# Patient Record
Sex: Male | Born: 1990 | Race: Black or African American | Hispanic: No | Marital: Single | Smoking: Former smoker
Health system: Southern US, Community
[De-identification: ages and names within clinical notes are randomized; demographics above are authoritative.]

## PROBLEM LIST (undated history)

## (undated) DIAGNOSIS — I10 Essential (primary) hypertension: Secondary | ICD-10-CM

## (undated) HISTORY — PX: ORTHOPEDIC SURGERY: SHX850

---
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
## 2014-04-01 ENCOUNTER — Emergency Department (HOSPITAL_COMMUNITY)
Admission: EM | Admit: 2014-04-01 | Discharge: 2014-04-01 | Disposition: A | Discharge status: Home / Self Care | Payer: Self-pay | Attending: Emergency Medicine | Admitting: Emergency Medicine

## 2014-04-01 ENCOUNTER — Encounter (HOSPITAL_COMMUNITY): Payer: Self-pay | Admitting: Emergency Medicine

## 2014-04-01 DIAGNOSIS — W268XXA Contact with other sharp object(s), not elsewhere classified, initial encounter: Secondary | ICD-10-CM | POA: Insufficient documentation

## 2014-04-01 DIAGNOSIS — S61511A Laceration without foreign body of right wrist, initial encounter: Secondary | ICD-10-CM

## 2014-04-01 DIAGNOSIS — Y9389 Activity, other specified: Secondary | ICD-10-CM | POA: Insufficient documentation

## 2014-04-01 DIAGNOSIS — Z87891 Personal history of nicotine dependence: Secondary | ICD-10-CM | POA: Insufficient documentation

## 2014-04-01 DIAGNOSIS — S61509A Unspecified open wound of unspecified wrist, initial encounter: Secondary | ICD-10-CM | POA: Insufficient documentation

## 2014-04-01 DIAGNOSIS — Y929 Unspecified place or not applicable: Secondary | ICD-10-CM | POA: Insufficient documentation

## 2014-04-01 LAB — HIV RAPID SCREEN (BLD OR BODY FLD EXPOSURE): Rapid HIV Screen: NONREACTIVE

## 2014-04-01 NOTE — Discharge Instructions (Signed)
Keep your wrist clean and dry. Do use antibiotic ointment on the wound. The sutures need to be removed in 10-12 days. Recheck sooner for any signs of infection. If you continue to have numbness in your thumb, call Dr Janee Morn, the hand specialist on call today to be rechecked in the next 7-10 days.

## 2014-04-01 NOTE — ED Notes (Signed)
During arrest with police, while closing handcuffs they lacerated his wrist. About a one inch laceration to right wrist. Not bleeding at this time. Also a scrape to left knee, not bleeding at this time.

## 2014-04-01 NOTE — ED Provider Notes (Signed)
CSN: [id number]     Arrival date & time 04/01/14  1202 History   First MD Initiated Contact with Patient 04/01/14 1222     Chief Complaint  Patient presents with  . Extremity Laceration     (Consider location/radiation/quality/duration/timing/severity/associated sxs/prior Treatment) HPI Patient presents to the emergency department escorted by police. Evidently during his arrest he sustained a laceration to his right wrist while he was having handcuffs placed. He complains of numbness of his right little and right ring finger which went away when his handcuffs were removed. He has an abrasion of his knee. He should states his last tetanus was less than 10 years ago. He is otherwise a healthy person.  PCP none  History reviewed. No pertinent past medical history. Past Surgical History  Procedure Laterality Date  . Orthopedic surgery Left     left arm broken twice; plate   History reviewed. No pertinent family history. History  Substance Use Topics  . Smoking status: Former Smoker    Types: Cigarettes    Quit date: 04/01/2013  . Smokeless tobacco: Not on file  . Alcohol Use: 1.8 oz/week    3 Cans of beer per week     Comment: once or twice a week  pt is a Paediatric nurse  Review of Systems  All other systems reviewed and are negative.     Allergies  Review of patient's allergies indicates no known allergies.  Home Medications   Prior to Admission medications   Not on File   BP 143/86  Pulse 69  Temp(Src) 98.4 F (36.9 C) (Oral)  Resp 18  Ht  (1.854 m)  Wt 226 lb (102.513 kg)  BMI 29.82 kg/m2  SpO2 99%  Vital signs normal   Physical Exam  Nursing note and vitals reviewed. Constitutional: He is oriented to person, place, and time. He appears well-developed and well-nourished.  Non-toxic appearance. He does not appear ill. No distress.  HENT:  Head: Normocephalic and atraumatic.  Right Ear: External ear normal.  Left Ear: External ear normal.  Nose: Nose  normal. No mucosal edema or rhinorrhea.  Mouth/Throat: Mucous membranes are normal. No dental abscesses or uvula swelling.  Eyes: Conjunctivae and EOM are normal. Pupils are equal, round, and reactive to light.  Neck: Normal range of motion and full passive range of motion without pain. Neck supple.  Pulmonary/Chest: Effort normal. No respiratory distress. He has no rhonchi. He exhibits no crepitus.  Abdominal: Soft. Normal appearance and bowel sounds are normal.  Musculoskeletal: Normal range of motion. He exhibits no edema and no tenderness.  Moves all extremities well.  Patient has a 1.5 cm laceration on the volar aspect of his right wrist at the base of the thenar eminence. The laceration is just through the dermis. Please see photo. He has good range of motion of his palm. He complains of some decreased sensation on the dorsum of his palm and a small area at the base of the thumb on the volar aspect. He has good range of motion of his thumb. When I test the strength of his radial and ulnar nerves he appears to have some weakness however he also has and weakness in his left not sure about patient effort.  Patient has an abrasion on his left anterior knee  Neurological: He is alert and oriented to person, place, and time. He has normal strength. No cranial nerve deficit.  Skin: Skin is warm, dry and intact. No rash noted. No erythema. No pallor.  Psychiatric: He has a normal mood and affect. His speech is normal and behavior is normal. His mood appears not anxious.        ED Course  Procedures (including critical care time)  LACERATION REPAIR Performed by: Gennie Eisinger L Authorized by: Ward Givens Consent: Verbal consent obtained. Risks and benefits: risks, benefits and alternatives were discussed Consent given by: patient Patient identity confirmed: provided demographic data Prepped and Draped in normal sterile fashion Wound explored  Laceration Location: right wrist volar  aspect  Laceration Length: 1.5 cm  No Foreign Bodies seen or palpated  Anesthesia: local infiltration  Local anesthetic: lidocaine 1%   Anesthetic total: 5 ml  Irrigation method: syringe Amount of cleaning: standard  Skin closure: 4-0 nylon   Number of sutures: 3   Technique: simple interrupte  Patient tolerance: Patient tolerated the procedure well with no immediate complications.      Labs Review   Imaging Review No results found.   EKG Interpretation None      MDM   Final diagnoses:  Laceration of wrist, right, initial encounter    Plan discharge  Devoria Albe, MD, Franz Dell, MD 04/01/14 [phone]

## 2014-04-01 NOTE — ED Notes (Signed)
Phlebotomy at bedside.

## 2014-04-02 LAB — HEPATITIS B SURFACE ANTIGEN: Hepatitis B Surface Ag: NEGATIVE

## 2014-04-02 LAB — HEPATITIS C ANTIBODY (REFLEX): HCV Ab: NEGATIVE

## 2022-08-13 ENCOUNTER — Ambulatory Visit: Payer: Self-pay

## 2022-08-27 ENCOUNTER — Ambulatory Visit: Payer: Self-pay

## 2022-08-27 NOTE — Telephone Encounter (Signed)
    Chief Complaint: Urinary burning, scrotal discomfort. Symptoms: Above, concerned about STI Frequency: 2-3 days ago Pertinent Negatives: Patient denies  Disposition: [] ED /[x] Urgent Care (no appt availability in office) / [] Appointment(In office/virtual)/ []  [facility]/ [] Home Care/ [] Refused Recommended Disposition /[] [facility]/ []  Follow-up with PCP Additional Notes:   Reason for Disposition  Urinating more frequently than usual (i.e., frequency)  Answer Assessment - Initial Assessment Questions 1. SYMPTOM: "What's the main symptom you're concerned about?" (e.g., frequency, incontinence)     Burning, scrotal discomfort. 2. ONSET: "When did the    start?"     3 days ago 3. PAIN: "Is there any pain?" If Yes, ask: "How bad is it?" (Scale: 1-10; mild, moderate, severe)     Discomfort 4. CAUSE: "What do you think is causing the symptoms?"     Unsure 5. OTHER SYMPTOMS: "Do you have any other symptoms?" (e.g., blood in urine, fever, flank pain, pain with urination)     None 6. PREGNANCY: "Is there any chance you are pregnant?" "When was your last menstrual period?"     N/a  Protocols used: Urinary Symptoms-A-AH

## 2022-08-28 ENCOUNTER — Ambulatory Visit
Admission: EM | Admit: 2022-08-28 | Discharge: 2022-08-28 | Disposition: A | Discharge status: Home / Self Care | Payer: Commercial Managed Care - HMO | Attending: Nurse Practitioner | Admitting: Nurse Practitioner

## 2022-08-28 DIAGNOSIS — R809 Proteinuria, unspecified: Secondary | ICD-10-CM | POA: Diagnosis not present

## 2022-08-28 DIAGNOSIS — R631 Polydipsia: Secondary | ICD-10-CM | POA: Insufficient documentation

## 2022-08-28 DIAGNOSIS — Z113 Encounter for screening for infections with a predominantly sexual mode of transmission: Secondary | ICD-10-CM | POA: Insufficient documentation

## 2022-08-28 DIAGNOSIS — R1030 Lower abdominal pain, unspecified: Secondary | ICD-10-CM | POA: Insufficient documentation

## 2022-08-28 DIAGNOSIS — R110 Nausea: Secondary | ICD-10-CM | POA: Diagnosis present

## 2022-08-28 DIAGNOSIS — R300 Dysuria: Secondary | ICD-10-CM | POA: Diagnosis not present

## 2022-08-28 HISTORY — DX: Essential (primary) hypertension: I10

## 2022-08-28 LAB — POCT URINALYSIS DIP (MANUAL ENTRY)
Bilirubin, UA: NEGATIVE
Glucose, UA: NEGATIVE mg/dL
Ketones, POC UA: NEGATIVE mg/dL
Leukocytes, UA: NEGATIVE
Nitrite, UA: NEGATIVE
Protein Ur, POC: >=300 mg/dL — AB
Spec Grav, UA: >=1.03 — AB (ref 1.010–1.025)
Urobilinogen, UA: 0.2 E.U./dL
pH, UA: 6 (ref 5.0–8.0)

## 2022-08-28 MED ORDER — ONDANSETRON 8 MG PO TBDP: 8.0000 mg | ORAL_TABLET | Freq: Three times a day (TID) | ORAL | 0 refills | Status: DC | PRN

## 2022-08-28 NOTE — Discharge Instructions (Signed)
You were seen today for your pain with urination and abdominal pain. Your urine test did not show any infection but we did see a high level of protein in your urine which is concerning.   Proteinuria is when there is too much protein in the urine. This may be caused by kidney damage or stress on the kidneys. Healthy kidneys have filters called glomeruli that keep protein out of the urine. Proteinuria can happen when the glomeruli are damaged which could be from diabetes and/or high blood pressure.    Managing your hypertension is very important. Please monitor your blood pressure closely and take your blood pressure medications as prescribed.   We are checking your blood to see if you have diabetes and any damage to your kidneys.   Testing for gonorrhea, chlamydia and trichomonas is also pending. You should not have any sexual activity until you receive the results of the tests.   You will only be notified for any positive and/or concerning results. You may go online to [facility] and review your results.   Practice safe sex practices by wearing a condom every time you have sex.   Go to the ED immediately if you develop a severe headache, dizziness, sudden vision problems, confusion, experience unusual weakness or numbness, severe pain in your chest or abdomen, you vomit repeatedly or have trouble breathing.

## 2022-08-28 NOTE — ED Triage Notes (Signed)
Pt c/o burning/dysuria, dark colored urine, nausea.   Denies penile discharge or skin lesions.   Onset ~ 1-2 days ago.

## 2022-08-28 NOTE — ED Provider Notes (Signed)
[facility]    CSN: [id number] Arrival date & time: 08/28/22  1438      History   Chief Complaint Chief Complaint  Patient presents with   sti screening    HPI Isaac Norris is a 32 y.o. male.   Subjective:   Isaac Norris is a 32 y.o. male who presents for evaluation of dysuria and abdominal pain. The pain is described as cramping, and is mild to moderate in severity. The pain is located in the lower abdomen and right flank region. The pain is without radiation. Onset was about 1 month ago and symptoms have been unchanged since. No aggravating or alleviating factors noted. Patient also reports nausea, body aches, diarrhea [x 2 episodes yesterday], urinary frequency, and increased thirst. He drinks a lot of fluids but still never feels to be adequately hydrated. Patient also reports a two day history of dysuria, strong smelling but without foul odor and dark colored urine. No penile discharge, anorexia, low back pain, fevers, vomiting, chills or penile lesions.  Patient is sexually active.  He reports 67 male sexual partners within the past 3 months.  He inconsistently uses condoms and reports a history of chlamydia in the past.  Patient was diagnosed with hypertension around 2016. He is prescribed lisinopril-HCTZ and takes this inconsistently, approximately 3-4 times a week.  He smokes marijuana.  Occasional alcohol consumption.  No tobacco products.  No vaping.  The following portions of the patient's history were reviewed and updated as appropriate: allergies, current medications, past family history, past medical history, past social history, past surgical history, and problem list.       Past Medical History:  Diagnosis Date   Hypertension     There are no problems to display for this patient.   Past Surgical History:  Procedure Laterality Date   ORTHOPEDIC SURGERY Left    left arm broken twice; plate       Home Medications    Prior to Admission  medications   Medication Sig Start Date End Date Taking? Authorizing Provider  lisinopril-hydrochlorothiazide (ZESTORETIC) 20-25 MG tablet Take 1 tablet by mouth daily.   Yes [provider]  ondansetron (ZOFRAN-ODT) 8 MG disintegrating tablet Take 1 tablet (8 mg total) by mouth every 8 (eight) hours as needed for nausea or vomiting. 08/28/22  Yes Enrique Sack, FNP    Family History History reviewed. No pertinent family history.  Social History Social History   Tobacco Use   Smoking status: Former    Types: Cigarettes    Quit date: 04/01/2013    Years since quitting: 9.4  Substance Use Topics   Alcohol use: Yes    Alcohol/week: 3.0 standard drinks of alcohol    Types: 3 Cans of beer per week    Comment: once or twice a week   Drug use: Yes    Types: Marijuana    Comment: daily     Allergies   Patient has no known allergies.   Review of Systems Review of Systems  Constitutional:  Negative for activity change, appetite change, fatigue and fever.  Gastrointestinal:  Positive for abdominal pain, diarrhea and nausea. Negative for vomiting.  Endocrine: Positive for polydipsia.  Genitourinary:  Positive for dysuria, flank pain and frequency. Negative for penile discharge, penile pain, penile swelling, scrotal swelling, testicular pain and urgency.  Musculoskeletal:  Negative for back pain.  Neurological:  Negative for dizziness and headaches.  All other systems reviewed and are negative.    Physical Exam Triage  Vital Signs ED Triage Vitals [08/28/22 1502]  Enc Vitals Group     BP (!) 141/84     Pulse Rate 80     Resp 16     Temp 98.6 F (37 C)     Temp Source Oral     SpO2 97 %     Weight      Height      Head Circumference      Peak Flow      Pain Score 0     Pain Loc      Pain Edu?      Excl. in GC?    No data found.  Updated Vital Signs BP (!) 141/84 (BP Location: Right Arm)   Pulse 80   Temp 98.6 F (37 C) (Oral)   Resp 16   SpO2 97%    Visual Acuity Right Eye Distance:   Left Eye Distance:   Bilateral Distance:    Right Eye Near:   Left Eye Near:    Bilateral Near:     Physical Exam Vitals reviewed.  Constitutional:      General: He is not in acute distress.    Appearance: Normal appearance. He is normal weight. He is not ill-appearing, toxic-appearing or diaphoretic.  HENT:     Head: Normocephalic.     Nose: Nose normal.  Eyes:     Conjunctiva/sclera: Conjunctivae normal.  Cardiovascular:     Rate and Rhythm: Normal rate and regular rhythm.  Pulmonary:     Effort: Pulmonary effort is normal.     Breath sounds: Normal breath sounds.  Abdominal:     Palpations: Abdomen is soft.     Tenderness: There is no abdominal tenderness. There is no right CVA tenderness, left CVA tenderness, guarding or rebound.  Genitourinary:    Comments: Deferred; patient performed self-swab for testing  Musculoskeletal:        General: Normal range of motion.     Cervical back: Normal range of motion and neck supple.  Skin:    General: Skin is warm and dry.  Neurological:     General: No focal deficit present.     Mental Status: He is alert and oriented to person, place, and time.      UC Treatments / Results  Labs (all labs ordered are listed, but only abnormal results are displayed) Labs Reviewed  POCT URINALYSIS DIP (MANUAL ENTRY) - Abnormal; Notable for the following components:      Result Value   Color, UA straw (*)    Clarity, UA cloudy (*)    Spec Grav, UA >=1.030 (*)    Blood, UA large (*)    Protein Ur, POC >=300 (*)    All other components within normal limits  COMPREHENSIVE METABOLIC PANEL  LIPASE  HEMOGLOBIN A1C  URINALYSIS, W/ REFLEX TO CULTURE (INFECTION SUSPECTED)  CYTOLOGY, (ORAL, ANAL, URETHRAL) ANCILLARY ONLY    EKG   Radiology No results found.  Procedures Procedures (including critical care time)  Medications Ordered in UC Medications - No data to display  Initial Impression /  Assessment and Plan / UC Course  I have reviewed the triage vital signs and the nursing notes.  Pertinent labs & imaging results that were available during my care of the patient were reviewed by me and considered in my medical decision making (see chart for details).    32 year old male presenting with dysuria, abdominal pain, right flank pain, diarrhea, polydipsia, and nausea. No penile discharge, anorexia, low  back pain, fevers, vomiting, chills or penile lesions.  Patient afebrile and nontoxic.  Patient hypertensive at 141/84.  All other vitals unremarkable.  Urinalysis negative for acute infection but noted for proteinuria.  Considering his inconsistency in taking his hypertensive medications and current symptoms, CMP, A1c and urine cultures obtained.  STD testing also pending.  Prescription for Zofran provided.  Patient encouraged to his blood pressure closely and to take his blood pressure medications as prescribed.  Patient has an appointment with PCP in April was advised to keep that appointment as scheduled.     Today's evaluation has revealed no signs of a dangerous process. Discussed diagnosis with patient and/or guardian. Patient and/or guardian aware of their diagnosis, possible red flag symptoms to watch out for and need for close follow up. Patient and/or guardian understands verbal and written discharge instructions. Patient and/or guardian comfortable with plan and disposition.  Patient and/or guardian has a clear mental status at this time, good insight into illness (after discussion and teaching) and has clear judgment to make decisions regarding their care  Documentation was completed with the aid of voice recognition software. Transcription may contain typographical errors. Final Clinical Impressions(s) / UC Diagnoses   Final diagnoses:  Proteinuria, unspecified type  Lower abdominal pain  Polydipsia  Dysuria  Screen for STD (sexually transmitted disease)  Nausea without  vomiting     Discharge Instructions      You were seen today for your pain with urination and abdominal pain. Your urine test did not show any infection but we did see a high level of protein in your urine which is concerning.   Proteinuria is when there is too much protein in the urine. This may be caused by kidney damage or stress on the kidneys. Healthy kidneys have filters called glomeruli that keep protein out of the urine. Proteinuria can happen when the glomeruli are damaged which could be from diabetes and/or high blood pressure.    Managing your hypertension is very important. Please monitor your blood pressure closely and take your blood pressure medications as prescribed.   We are checking your blood to see if you have diabetes and any damage to your kidneys.   Testing for gonorrhea, chlamydia and trichomonas is also pending. You should not have any sexual activity until you receive the results of the tests.   You will only be notified for any positive and/or concerning results. You may go online to [facility] and review your results.   Practice safe sex practices by wearing a condom every time you have sex.   Go to the ED immediately if you develop a severe headache, dizziness, sudden vision problems, confusion, experience unusual weakness or numbness, severe pain in your chest or abdomen, you vomit repeatedly or have trouble breathing.     ED Prescriptions     Medication Sig Dispense Auth. Provider   ondansetron (ZOFRAN-ODT) 8 MG disintegrating tablet Take 1 tablet (8 mg total) by mouth every 8 (eight) hours as needed for nausea or vomiting. 20 tablet Enrique Sack, FNP      PDMP not reviewed this encounter.   Enrique Sack, [place] 08/28/22 [phone]

## 2022-08-29 ENCOUNTER — Ambulatory Visit: Payer: Self-pay | Admitting: *Deleted

## 2022-08-29 ENCOUNTER — Telehealth: Payer: Self-pay | Admitting: Physician Assistant

## 2022-08-29 DIAGNOSIS — R809 Proteinuria, unspecified: Secondary | ICD-10-CM | POA: Insufficient documentation

## 2022-08-29 LAB — COMPREHENSIVE METABOLIC PANEL
ALT: 148 IU/L — ABNORMAL HIGH (ref 0–44)
AST: 789 IU/L (ref 0–40)
Albumin/Globulin Ratio: 1.8 (ref 1.2–2.2)
Albumin: 4.6 g/dL (ref 4.1–5.1)
Alkaline Phosphatase: 66 IU/L (ref 44–121)
BUN/Creatinine Ratio: 11 (ref 9–20)
BUN: 14 mg/dL (ref 6–20)
Bilirubin Total: 0.5 mg/dL (ref 0.0–1.2)
CO2: 21 mmol/L (ref 20–29)
Calcium: 10.2 mg/dL (ref 8.7–10.2)
Chloride: 98 mmol/L (ref 96–106)
Creatinine, Ser: 1.31 mg/dL — ABNORMAL HIGH (ref 0.76–1.27)
Globulin, Total: 2.5 g/dL (ref 1.5–4.5)
Glucose: 99 mg/dL (ref 70–99)
Potassium: 3.8 mmol/L (ref 3.5–5.2)
Sodium: 137 mmol/L (ref 134–144)
Total Protein: 7.1 g/dL (ref 6.0–8.5)
eGFR: 75 mL/min/{1.73_m2} (ref >59)

## 2022-08-29 LAB — CYTOLOGY, (ORAL, ANAL, URETHRAL) ANCILLARY ONLY
Chlamydia: POSITIVE — AB
Comment: NEGATIVE
Comment: NEGATIVE
Comment: NORMAL
Neisseria Gonorrhea: NEGATIVE
Trichomonas: NEGATIVE

## 2022-08-29 LAB — LIPASE: Lipase: 54 U/L (ref 13–78)

## 2022-08-29 LAB — HEMOGLOBIN A1C
Est. average glucose Bld gHb Est-mCnc: 111 mg/dL
Hgb A1c MFr Bld: 5.5 % (ref 4.8–5.6)

## 2022-08-29 NOTE — Telephone Encounter (Signed)
Opened chart to answer question for the agent.   He is demanding to be seen right away at [facility] at [facility].   Just at the urgent care today.  He has an appt. With them for 11/15/2022 with Amy Minette Brine to be established.  He is threatening to go back to the hospital and demand they give him a CT scan.   I let agent know if he wants to go to the hospital that is his choice but I can't get him in sooner at [facility].

## 2022-08-29 NOTE — Telephone Encounter (Signed)
Alternative order required for urine culture.  

## 2022-08-30 ENCOUNTER — Other Ambulatory Visit (HOSPITAL_COMMUNITY)
Admit: 2022-08-30 | Discharge: 2022-08-30 | Disposition: A | Discharge status: Home / Self Care | Payer: Commercial Managed Care - HMO | Source: Ambulatory Visit | Attending: Physician Assistant | Admitting: Physician Assistant

## 2022-08-30 ENCOUNTER — Telehealth (HOSPITAL_COMMUNITY): Payer: Self-pay | Admitting: Emergency Medicine

## 2022-08-30 LAB — CULTURE, OB URINE: Culture: NO GROWTH

## 2022-08-30 MED ORDER — DOXYCYCLINE HYCLATE 100 MG PO CAPS: 100.0000 mg | ORAL_CAPSULE | Freq: Two times a day (BID) | ORAL | 0 refills | Status: AC

## 2022-09-01 ENCOUNTER — Emergency Department (HOSPITAL_COMMUNITY)
Admission: EM | Admit: 2022-09-01 | Discharge: 2022-09-01 | Disposition: A | Discharge status: Home / Self Care | Payer: Commercial Managed Care - HMO | Attending: Emergency Medicine | Admitting: Emergency Medicine

## 2022-09-01 ENCOUNTER — Emergency Department (HOSPITAL_COMMUNITY): Payer: Commercial Managed Care - HMO

## 2022-09-01 ENCOUNTER — Other Ambulatory Visit: Payer: Self-pay

## 2022-09-01 DIAGNOSIS — R7989 Other specified abnormal findings of blood chemistry: Secondary | ICD-10-CM

## 2022-09-01 DIAGNOSIS — R1031 Right lower quadrant pain: Secondary | ICD-10-CM | POA: Insufficient documentation

## 2022-09-01 DIAGNOSIS — R7401 Elevation of levels of liver transaminase levels: Secondary | ICD-10-CM | POA: Insufficient documentation

## 2022-09-01 DIAGNOSIS — R748 Abnormal levels of other serum enzymes: Secondary | ICD-10-CM

## 2022-09-01 LAB — CBC WITH DIFFERENTIAL/PLATELET
Abs Immature Granulocytes: 0.01 10*3/uL (ref 0.00–0.07)
Basophils Absolute: 0 10*3/uL (ref 0.0–0.1)
Basophils Relative: 0 %
Eosinophils Absolute: 0.1 10*3/uL (ref 0.0–0.5)
Eosinophils Relative: 1 %
HCT: 44.4 % (ref 39.0–52.0)
Hemoglobin: 15.8 g/dL (ref 13.0–17.0)
Immature Granulocytes: 0 %
Lymphocytes Relative: 31 %
Lymphs Abs: 1.5 10*3/uL (ref 0.7–4.0)
MCH: 31.2 pg (ref 26.0–34.0)
MCHC: 35.6 g/dL (ref 30.0–36.0)
MCV: 87.6 fL (ref 80.0–100.0)
Monocytes Absolute: 0.4 10*3/uL (ref 0.1–1.0)
Monocytes Relative: 9 %
Neutro Abs: 2.8 10*3/uL (ref 1.7–7.7)
Neutrophils Relative %: 59 %
Platelets: 199 10*3/uL (ref 150–400)
RBC: 5.07 MIL/uL (ref 4.22–5.81)
RDW: 12.9 % (ref 11.5–15.5)
WBC: 4.8 10*3/uL (ref 4.0–10.5)
nRBC: 0 % (ref 0.0–0.2)

## 2022-09-01 LAB — HEPATITIS PANEL, ACUTE
HCV Ab: NONREACTIVE
Hep A IgM: NONREACTIVE
Hep B C IgM: NONREACTIVE
Hepatitis B Surface Ag: NONREACTIVE

## 2022-09-01 LAB — COMPREHENSIVE METABOLIC PANEL
ALT: 197 U/L — ABNORMAL HIGH (ref 0–44)
AST: 572 U/L — ABNORMAL HIGH (ref 15–41)
Albumin: 4.1 g/dL (ref 3.5–5.0)
Alkaline Phosphatase: 51 U/L (ref 38–126)
Anion gap: 12 (ref 5–15)
BUN: 19 mg/dL (ref 6–20)
CO2: 27 mmol/L (ref 22–32)
Calcium: 9.7 mg/dL (ref 8.9–10.3)
Chloride: 95 mmol/L — ABNORMAL LOW (ref 98–111)
Creatinine, Ser: 1.18 mg/dL (ref 0.61–1.24)
GFR, Estimated: >60 mL/min (ref >60)
Glucose, Bld: 131 mg/dL — ABNORMAL HIGH (ref 70–99)
Potassium: 3.3 mmol/L — ABNORMAL LOW (ref 3.5–5.1)
Sodium: 134 mmol/L — ABNORMAL LOW (ref 135–145)
Total Bilirubin: 0.8 mg/dL (ref 0.3–1.2)
Total Protein: 6.7 g/dL (ref 6.5–8.1)

## 2022-09-01 LAB — LIPASE, BLOOD: Lipase: 61 U/L — ABNORMAL HIGH (ref 11–51)

## 2022-09-01 MED ORDER — OMEPRAZOLE 20 MG PO CPDR: 20.0000 mg | DELAYED_RELEASE_CAPSULE | Freq: Every day | ORAL | 0 refills | Status: DC

## 2022-09-01 MED ORDER — IOHEXOL 350 MG/ML SOLN
75.0000 mL | Freq: Once | INTRAVENOUS | Status: AC | PRN
  Administered 2022-09-01: 75 mL via INTRAVENOUS

## 2022-09-01 NOTE — ED Provider Notes (Signed)
[place] EMERGENCY DEPARTMENT AT [facility] Provider Note   CSN: [id number] Arrival date & time: 09/01/22  0127     History  Chief Complaint  Patient presents with   Abnormal Test Results    Isaac Norris is a 32 y.o. male.  HPI    32 year old male comes in with chief complaint of abnormal test results. Patient was seen at the urgent care recently.  He had basic labs, LFTs, urine test for STI evaluation.  He was positive for chlamydia.  His LFTs were elevated, lipase was elevated.  He had been complaining of abdominal pain, call urgent care for a close PCP follow-up, but they were unable to give him a follow-up.  He was advised that he can come to the ER if needed.  Patient is abdominal pain and has been present for over 6 months, it is right-sided, lower quadrant.  Pain is described as cramping with no specific evoking, aggravating or relieving factors.  Patient had couple of weeks of heavy drinking, but in general is not a heavy drinker.  He denies any history of IV drug abuse.  He has chronic GERD that is not something he takes medications for.  Home Medications Prior to Admission medications   Medication Sig Start Date End Date Taking? Authorizing Provider  omeprazole (PRILOSEC) 20 MG capsule Take 1 capsule (20 mg total) by mouth daily. 09/01/22  Yes Derwood Kaplan, MD  doxycycline (VIBRAMYCIN) 100 MG capsule Take 1 capsule (100 mg total) by mouth 2 (two) times daily for 7 days. 08/30/22 09/06/22  Merrilee Jansky, MD  lisinopril-hydrochlorothiazide (ZESTORETIC) 20-25 MG tablet Take 1 tablet by mouth daily.    [provider]  ondansetron (ZOFRAN-ODT) 8 MG disintegrating tablet Take 1 tablet (8 mg total) by mouth every 8 (eight) hours as needed for nausea or vomiting. 08/28/22   Lurline Idol, FNP      Allergies    Patient has no known allergies.    Review of Systems   Review of Systems  All other systems reviewed and are negative.   Physical  Exam Updated Vital Signs BP (!) 149/84 (BP Location: Right Arm)   Pulse 65   Temp 98.4 F (36.9 C) (Oral)   Resp 16   SpO2 99%  Physical Exam Vitals and nursing note reviewed.  Constitutional:      Appearance: He is well-developed.  HENT:     Head: Atraumatic.  Cardiovascular:     Rate and Rhythm: Normal rate.  Pulmonary:     Effort: Pulmonary effort is normal.  Musculoskeletal:     Cervical back: Neck supple.  Skin:    General: Skin is warm.  Neurological:     Mental Status: He is alert and oriented to person, place, and time.     ED Results / Procedures / Treatments   Labs (all labs ordered are listed, but only abnormal results are displayed) Labs Reviewed  LIPASE, BLOOD - Abnormal; Notable for the following components:      Result Value   Lipase 61 (*)    All other components within normal limits  COMPREHENSIVE METABOLIC PANEL - Abnormal; Notable for the following components:   Sodium 134 (*)    Potassium 3.3 (*)    Chloride 95 (*)    Glucose, Bld 131 (*)    AST 572 (*)    ALT 197 (*)    All other components within normal limits  CBC WITH DIFFERENTIAL/PLATELET  HEPATITIS PANEL, ACUTE  EKG None  Radiology CT ABDOMEN PELVIS W CONTRAST  Result Date: 09/01/2022 CLINICAL DATA:  Abdominal pain.  Elevated LFTs. EXAM: CT ABDOMEN AND PELVIS WITH CONTRAST TECHNIQUE: Multidetector CT imaging of the abdomen and pelvis was performed using the standard protocol following bolus administration of intravenous contrast. RADIATION DOSE REDUCTION: This exam was performed according to the departmental dose-optimization program which includes automated exposure control, adjustment of the mA and/or kV according to patient size and/or use of iterative reconstruction technique. CONTRAST:  12mL OMNIPAQUE IOHEXOL 350 MG/ML SOLN COMPARISON:  None Available. FINDINGS: Lower chest: Unremarkable. Hepatobiliary: No suspicious focal abnormality within the liver parenchyma. Tiny hypodensity  in the central right liver is much too small to characterize but is statistically most likely benign. No followup imaging is recommended. There is no evidence for gallstones, gallbladder wall thickening, or pericholecystic fluid. No intrahepatic or extrahepatic biliary dilation. Pancreas: No focal mass lesion. No dilatation of the main duct. No intraparenchymal cyst. No peripancreatic edema. Spleen: No splenomegaly. No focal mass lesion. Adrenals/Urinary Tract: No adrenal nodule or mass. Kidneys unremarkable. No evidence for hydroureter. The urinary bladder appears normal for the degree of distention. Stomach/Bowel: Stomach is unremarkable. No gastric wall thickening. No evidence of outlet obstruction. Duodenum is normally positioned as is the ligament of Treitz. No small bowel wall thickening. No small bowel dilatation. The terminal ileum is normal. The appendix is normal. No gross colonic mass. No colonic wall thickening. Vascular/Lymphatic: No abdominal aortic aneurysm. No abdominal aortic atherosclerotic calcification. There is no gastrohepatic or hepatoduodenal ligament lymphadenopathy. No retroperitoneal or mesenteric lymphadenopathy. No pelvic sidewall lymphadenopathy. Reproductive: The prostate gland and seminal vesicles are unremarkable. Other: No intraperitoneal free fluid. Musculoskeletal: No worrisome lytic or sclerotic osseous abnormality. Subtle edema noted in the subcutaneous fat of the abdomen bilaterally, indeterminate. IMPRESSION: No acute findings in the abdomen or pelvis. Specifically, no findings to explain the patient's history of abdominal pain and elevated LFTs. Electronically Signed   By: Misty Stanley M.D.   On: 09/01/2022 06:32   US Abdomen Limited RUQ (LIVER/GB)  Result Date: 09/01/2022 CLINICAL DATA:  Right upper quadrant pain EXAM: ULTRASOUND ABDOMEN LIMITED RIGHT UPPER QUADRANT COMPARISON:  None Available. FINDINGS: Gallbladder: No gallstones or wall thickening visualized. No  sonographic Murphy sign noted by sonographer. Common bile duct: Diameter: 3.5 mm Liver: No focal lesion identified. Within normal limits in parenchymal echogenicity. Portal vein is patent on color Doppler imaging with normal direction of blood flow towards the liver. Other: None. IMPRESSION: Unremarkable right upper quadrant ultrasound. Electronically Signed   By: Placido Sou M.D.   On: 09/01/2022 03:13    Procedures Procedures    Medications Ordered in ED Medications  iohexol (OMNIPAQUE) 350 MG/ML injection 75 mL (75 mLs Intravenous Contrast Given 09/01/22 0545)    ED Course/ Medical Decision Making/ A&P                             Medical Decision Making Problems Addressed: Elevated LFTs: undiagnosed new problem with uncertain prognosis Elevated lipase: undiagnosed new problem with uncertain prognosis  Risk Prescription drug management.   32 year old male comes in with chief complaint of abnormal labs and chronic abdominal pain.  I have reviewed patient's previous outpatient visits.  I have reviewed patient's previous LFTs, lipase and also urine that was positive for chlamydia.  Patient has already received prescription for his chlamydia infection.  The abdominal pain is chronic in nature.  He had a CT  scan of the abdomen completed, ultrasound of the abdomen completed and they are both reassuring.  He needs repeat LFTs completed by his PCP.  If the LFTs remain persistently elevated, then he will need GI evaluation.  This recommendation was discussed with the patient.  LFTs could be elevated because of GERD/gastritis.  We have given him omeprazole prescription to see if that helps.  We have sent hepatitis panel.  We have also advised that patient follows up with the PCP for repeat urine test to ensure that the protein and hematuria have cleared.  Final Clinical Impression(s) / ED Diagnoses Final diagnoses:  Elevated LFTs  Elevated lipase    Rx / DC Orders ED Discharge Orders           Ordered    omeprazole (PRILOSEC) 20 MG capsule  Daily        09/01/22 0827              Varney Biles, MD 09/01/22 [phone]

## 2022-09-01 NOTE — ED Provider Triage Note (Signed)
  Emergency Medicine Provider Triage Evaluation Note  MRN:  [id number]  Arrival date & time: 09/01/22    Medically screening exam initiated at 1:58 AM.   CC:   Abnormal Test Results   HPI:  Isaac Norris is a 32 y.o. year-old male presents to the ED with chief complaint of RUQ pain and elevated LFTs.  Pain began earlier in the week.  Had labs and UCC and sent to ED due to elevated LFTs.  Denies recent Tylenol use. Has been drinking recently, but doesn't seem intoxicated tonight.  History provided by patient. ROS:  -As included in HPI PE:   Vitals:   09/01/22 0142  BP: (!) 173/103  Pulse: 69  Resp: 18  Temp: 98.7 F (37.1 C)  SpO2: 100%    Non-toxic appearing No respiratory distress  MDM:  Based on signs and symptoms, gallbladder disease is highest on my differential, followed by hepatitis. I've ordered labs in triage to expedite lab/diagnostic workup.  Patient was informed that the remainder of the evaluation will be completed by another provider, this initial triage assessment does not replace that evaluation, and the importance of remaining in the ED until their evaluation is complete.    Montine Circle, PA-C 09/01/22 0200

## 2022-09-01 NOTE — ED Triage Notes (Signed)
Patient reports abnormal urinalysis and elevated liver enzymes tests results this week . He adds right lower lateral abdominal pain for several weeks .

## 2022-09-01 NOTE — Discharge Instructions (Signed)
You are seen in the ER for abnormal labs.  CT scan of the abdomen, ultrasound of the abdomen are reassuring.  You have already received treatment for STI by your PCP.  For the elevated liver enzymes, we have sent a hepatitis panel.   You will need repeat LFTs completed by your PCP. He will need a repeat urine analysis to ensure that it has also cleared by your PCP.  In the interim, take omeprazole for GERD and avoid any kind of toxins such as alcohol, drugs.

## 2022-09-23 ENCOUNTER — Emergency Department (HOSPITAL_COMMUNITY): Payer: Commercial Managed Care - HMO

## 2022-09-23 ENCOUNTER — Encounter (HOSPITAL_COMMUNITY): Payer: Self-pay | Admitting: Emergency Medicine

## 2022-09-23 ENCOUNTER — Emergency Department (HOSPITAL_COMMUNITY)
Admission: EM | Admit: 2022-09-23 | Discharge: 2022-09-23 | Disposition: A | Discharge status: Home / Self Care | Payer: Commercial Managed Care - HMO | Attending: Emergency Medicine | Admitting: Emergency Medicine

## 2022-09-23 ENCOUNTER — Other Ambulatory Visit: Payer: Self-pay

## 2022-09-23 ENCOUNTER — Telehealth: Payer: Commercial Managed Care - HMO | Admitting: Physician Assistant

## 2022-09-23 DIAGNOSIS — R1011 Right upper quadrant pain: Secondary | ICD-10-CM | POA: Insufficient documentation

## 2022-09-23 DIAGNOSIS — R112 Nausea with vomiting, unspecified: Secondary | ICD-10-CM | POA: Insufficient documentation

## 2022-09-23 DIAGNOSIS — R109 Unspecified abdominal pain: Secondary | ICD-10-CM | POA: Diagnosis present

## 2022-09-23 LAB — COMPREHENSIVE METABOLIC PANEL
ALT: 30 U/L (ref 0–44)
AST: 45 U/L — ABNORMAL HIGH (ref 15–41)
Albumin: 4.6 g/dL (ref 3.5–5.0)
Alkaline Phosphatase: 58 U/L (ref 38–126)
Anion gap: 18 — ABNORMAL HIGH (ref 5–15)
BUN: 14 mg/dL (ref 6–20)
CO2: 18 mmol/L — ABNORMAL LOW (ref 22–32)
Calcium: 9.3 mg/dL (ref 8.9–10.3)
Chloride: 97 mmol/L — ABNORMAL LOW (ref 98–111)
Creatinine, Ser: 1.42 mg/dL — ABNORMAL HIGH (ref 0.61–1.24)
GFR, Estimated: >60 mL/min (ref >60)
Glucose, Bld: 185 mg/dL — ABNORMAL HIGH (ref 70–99)
Potassium: 3.5 mmol/L (ref 3.5–5.1)
Sodium: 133 mmol/L — ABNORMAL LOW (ref 135–145)
Total Bilirubin: 1.4 mg/dL — ABNORMAL HIGH (ref 0.3–1.2)
Total Protein: 7.4 g/dL (ref 6.5–8.1)

## 2022-09-23 LAB — CBC WITH DIFFERENTIAL/PLATELET
Abs Immature Granulocytes: 0.06 10*3/uL (ref 0.00–0.07)
Basophils Absolute: 0 10*3/uL (ref 0.0–0.1)
Basophils Relative: 0 %
Eosinophils Absolute: 0 10*3/uL (ref 0.0–0.5)
Eosinophils Relative: 0 %
HCT: 46.8 % (ref 39.0–52.0)
Hemoglobin: 16.8 g/dL (ref 13.0–17.0)
Immature Granulocytes: 1 %
Lymphocytes Relative: 13 %
Lymphs Abs: 0.9 10*3/uL (ref 0.7–4.0)
MCH: 31.2 pg (ref 26.0–34.0)
MCHC: 35.9 g/dL (ref 30.0–36.0)
MCV: 87 fL (ref 80.0–100.0)
Monocytes Absolute: 0.4 10*3/uL (ref 0.1–1.0)
Monocytes Relative: 6 %
Neutro Abs: 5.4 10*3/uL (ref 1.7–7.7)
Neutrophils Relative %: 80 %
Platelets: 249 10*3/uL (ref 150–400)
RBC: 5.38 MIL/uL (ref 4.22–5.81)
RDW: 13.4 % (ref 11.5–15.5)
WBC: 6.7 10*3/uL (ref 4.0–10.5)
nRBC: 0 % (ref 0.0–0.2)

## 2022-09-23 LAB — I-STAT VENOUS BLOOD GAS, ED
Acid-base deficit: 1 mmol/L (ref 0.0–2.0)
Bicarbonate: 22.5 mmol/L (ref 20.0–28.0)
Calcium, Ion: 1.06 mmol/L — ABNORMAL LOW (ref 1.15–1.40)
HCT: 43 % (ref 39.0–52.0)
Hemoglobin: 14.6 g/dL (ref 13.0–17.0)
O2 Saturation: 82 %
Potassium: 4 mmol/L (ref 3.5–5.1)
Sodium: 137 mmol/L (ref 135–145)
TCO2: 24 mmol/L (ref 22–32)
pCO2, Ven: 35 mmHg — ABNORMAL LOW (ref 44–60)
pH, Ven: 7.416 (ref 7.25–7.43)
pO2, Ven: 45 mmHg (ref 32–45)

## 2022-09-23 LAB — HEMOGLOBIN A1C
Hgb A1c MFr Bld: 5.8 % — ABNORMAL HIGH (ref 4.8–5.6)
Mean Plasma Glucose: 119.76 mg/dL

## 2022-09-23 LAB — LIPASE, BLOOD: Lipase: 39 U/L (ref 11–51)

## 2022-09-23 LAB — BETA-HYDROXYBUTYRIC ACID: Beta-Hydroxybutyric Acid: 0.2 mmol/L (ref 0.05–0.27)

## 2022-09-23 MED ORDER — SODIUM CHLORIDE 0.9 % IV SOLN
12.5000 mg | Freq: Four times a day (QID) | INTRAVENOUS | Status: DC | PRN
  Administered 2022-09-23: 12.5 mg via INTRAVENOUS
  Filled 2022-09-23: qty 0.5

## 2022-09-23 MED ORDER — METOCLOPRAMIDE HCL 5 MG/ML IJ SOLN
10.0000 mg | Freq: Once | INTRAMUSCULAR | Status: AC
  Administered 2022-09-23: 10 mg via INTRAVENOUS
  Filled 2022-09-23: qty 2

## 2022-09-23 MED ORDER — HYDROMORPHONE HCL 1 MG/ML IJ SOLN
1.0000 mg | Freq: Once | INTRAMUSCULAR | Status: AC
  Administered 2022-09-23: 1 mg via INTRAVENOUS
  Filled 2022-09-23: qty 1

## 2022-09-23 MED ORDER — PROMETHAZINE HCL 25 MG RE SUPP: 25.0000 mg | Freq: Three times a day (TID) | RECTAL | 0 refills | Status: DC | PRN

## 2022-09-23 MED ORDER — FAMOTIDINE IN NACL 20-0.9 MG/50ML-% IV SOLN
20.0000 mg | Freq: Once | INTRAVENOUS | Status: AC
  Administered 2022-09-23: 20 mg via INTRAVENOUS
  Filled 2022-09-23: qty 50

## 2022-09-23 MED ORDER — ONDANSETRON HCL 4 MG/2ML IJ SOLN
4.0000 mg | Freq: Once | INTRAMUSCULAR | Status: AC
  Administered 2022-09-23: 4 mg via INTRAVENOUS
  Filled 2022-09-23: qty 2

## 2022-09-23 MED ORDER — ONDANSETRON 4 MG PO TBDP: 4.0000 mg | ORAL_TABLET | Freq: Three times a day (TID) | ORAL | 0 refills | Status: DC | PRN

## 2022-09-23 MED ORDER — SODIUM CHLORIDE 0.9 % IV BOLUS
1000.0000 mL | Freq: Once | INTRAVENOUS | Status: AC
  Administered 2022-09-23: 1000 mL via INTRAVENOUS

## 2022-09-23 MED ORDER — DICYCLOMINE HCL 20 MG PO TABS: 20.0000 mg | ORAL_TABLET | Freq: Two times a day (BID) | ORAL | 0 refills | Status: AC | PRN

## 2022-09-23 NOTE — Progress Notes (Signed)
The patient no-showed for appointment despite this provider sending direct link and waiting for at least 10 minutes from appointment time for patient to join. They will be marked as a NS for this appointment/time.   Leeanne Rio, PA-C

## 2022-09-23 NOTE — ED Triage Notes (Signed)
Pt BIB EMS from home complaining of abdominal pain in the upper right quadrant, started this AM. Patient had nausea and diarrhea this morning, not comfortable in any position. Patient is 10/10 pain. Patient became really pale and lethargic during transport with EMS. No tenderness or rigidity in abdomen.   EMS Vitals 152/80 HR 83 RR 28 100% on RA CBG 173

## 2022-09-23 NOTE — ED Provider Notes (Signed)
[name] Provider Note   CSN: [id number] Arrival date & time: 09/23/22  1643     History  Chief Complaint  Patient presents with   Abdominal Pain    Isaac Norris is a 32 y.o. male presenting to the ED with abdominal pain.  Reports abdominal pain in his right upper quadrant began earlier this morning.  He has been nauseated and vomiting.  The pain is sharp and stabbing and intense.  It is 10 out of 10.  She does suffer from reflux and heartburn but feels that this is quite different.  He was seen in the emergency department in January for abdominal pain at that time was noted to have a transaminitis and elevated lipase level.  He has not been able to follow-up since then.  He reports he does not drink alcohol daily but has been drinking alcohol for the past few days, cannot quantify exactly how much.  He denies any history of abdominal surgery.  On his prior visit to the ED at the end of January he had a CT scan of the abdomen as well as right upper quadrant ultrasound both of which were unremarkable.  There was no clear cause of his symptoms.  His hepatitis panel in January was negative and nonreactive. AST was 572. ALT 197, Lipase 61  HPI     Home Medications Prior to Admission medications   Medication Sig Start Date End Date Taking? Authorizing Provider  dicyclomine (BENTYL) 20 MG tablet Take 1 tablet (20 mg total) by mouth 2 (two) times daily as needed for up to 14 doses for spasms. 09/23/22  Yes Wyvonnia Dusky, MD  ondansetron (ZOFRAN-ODT) 4 MG disintegrating tablet Take 1 tablet (4 mg total) by mouth every 8 (eight) hours as needed for up to 15 doses for nausea or vomiting. 09/23/22  Yes Marybel Alcott, Carola Rhine, MD  promethazine (PHENERGAN) 25 MG suppository Place 1 suppository (25 mg total) rectally every 8 (eight) hours as needed for up to 6 doses for nausea or vomiting. 09/23/22  Yes Lindsi Bayliss, Carola Rhine, MD  lisinopril-hydrochlorothiazide  (ZESTORETIC) 20-25 MG tablet Take 1 tablet by mouth daily.    [provider]  omeprazole (PRILOSEC) 20 MG capsule Take 1 capsule (20 mg total) by mouth daily. 09/01/22   Varney Biles, MD  ondansetron (ZOFRAN-ODT) 8 MG disintegrating tablet Take 1 tablet (8 mg total) by mouth every 8 (eight) hours as needed for nausea or vomiting. 08/28/22   Enrique Sack, FNP      Allergies    Patient has no known allergies.    Review of Systems   Review of Systems  Physical Exam Updated Vital Signs BP (!) 137/54   Pulse 60   Temp 98.3 F (36.8 C) (Oral)   Resp 18   Ht 6' 1"$  (1.854 m)   Wt 104.3 kg   SpO2 100%   BMI 30.34 kg/m  Physical Exam Constitutional:      General: He is not in acute distress.    Appearance: He is ill-appearing.  HENT:     Head: Normocephalic and atraumatic.  Eyes:     Conjunctiva/sclera: Conjunctivae normal.     Pupils: Pupils are equal, round, and reactive to light.  Cardiovascular:     Rate and Rhythm: Normal rate and regular rhythm.  Pulmonary:     Effort: Pulmonary effort is normal. No respiratory distress.  Abdominal:     General: There is no distension.  Tenderness: There is abdominal tenderness in the right upper quadrant and epigastric area.  Skin:    General: Skin is warm and dry.  Neurological:     General: No focal deficit present.     Mental Status: He is alert. Mental status is at baseline.  Psychiatric:        Mood and Affect: Mood normal.        Behavior: Behavior normal.     ED Results / Procedures / Treatments   Labs (all labs ordered are listed, but only abnormal results are displayed) Labs Reviewed  COMPREHENSIVE METABOLIC PANEL - Abnormal; Notable for the following components:      Result Value   Sodium 133 (*)    Chloride 97 (*)    CO2 18 (*)    Glucose, Bld 185 (*)    Creatinine, Ser 1.42 (*)    AST 45 (*)    Total Bilirubin 1.4 (*)    Anion gap 18 (*)    All other components within normal limits   HEMOGLOBIN A1C - Abnormal; Notable for the following components:   Hgb A1c MFr Bld 5.8 (*)    All other components within normal limits  I-STAT VENOUS BLOOD GAS, ED - Abnormal; Notable for the following components:   pCO2, Ven 35.0 (*)    Calcium, Ion 1.06 (*)    All other components within normal limits  CBC WITH DIFFERENTIAL/PLATELET  LIPASE, BLOOD  BETA-HYDROXYBUTYRIC ACID  URINALYSIS, ROUTINE W REFLEX MICROSCOPIC    EKG None  Radiology US Abdomen Limited RUQ (LIVER/GB)  Result Date: 09/23/2022 CLINICAL DATA:  Abdominal pain EXAM: ULTRASOUND ABDOMEN LIMITED RIGHT UPPER QUADRANT COMPARISON:  CT and ultrasound from 09/01/2022 FINDINGS: Gallbladder: No gallstones or wall thickening visualized. No sonographic Murphy sign noted by sonographer. Common bile duct: Diameter: 4.8 mm. Liver: No focal lesion identified. Within normal limits in parenchymal echogenicity. Portal vein is patent on color Doppler imaging with normal direction of blood flow towards the liver. Other: None. IMPRESSION: No acute abnormality noted.  No change from the prior exam. Electronically Signed   By: Inez Catalina M.D.   On: 09/23/2022 19:34    Procedures Procedures    Medications Ordered in ED Medications  promethazine (PHENERGAN) 12.5 mg in sodium chloride 0.9 % 50 mL IVPB (0 mg Intravenous Stopped 09/23/22 2141)  HYDROmorphone (DILAUDID) injection 1 mg (1 mg Intravenous Given 09/23/22 1709)  ondansetron (ZOFRAN) injection 4 mg (4 mg Intravenous Given 09/23/22 1706)  sodium chloride 0.9 % bolus 1,000 mL (0 mLs Intravenous Stopped 09/23/22 1809)  famotidine (PEPCID) IVPB 20 mg premix (0 mg Intravenous Stopped 09/23/22 1809)  metoCLOPramide (REGLAN) injection 10 mg (10 mg Intravenous Given 09/23/22 1917)  sodium chloride 0.9 % bolus 1,000 mL (0 mLs Intravenous Stopped 09/23/22 2129)    ED Course/ Medical Decision Making/ A&P Clinical Course as of 09/23/22 2323  Mon Sep 23, 2022  1739 Patient's labs show  resolution of his transaminitis as well as his elevated lipase level.  He has a minor anion gap elevation which could be related to some dehydration, also minor creatinine elevation does not meet AKI criteria.  Mild hyperglycemia. [MT]  2149 Patient is reassessed and still feels very nauseated.  He wants more time to see if the medicine may have some effect.  Nursing advised p.o. challenge and if he is ready.  His remaining labs do not show evidence of DKA or new onset diabetes.  The digoxin level is normal.  A1c level is  borderline diabetic at 5.8.  There is no acidosis. [MT]  2308 Patient is feeling better and able to tolerate fluids here now.  He is wanting to go home and I think this is reasonable.  I will prescribe him Phenergan and 2 forms that he can use at home.  This seemed to work the best for him.  Also provided GI referral and encouraged him to follow-up with gastroenterology, as he may be experiencing gastroparesis, and also there was no clear etiology for his transient transaminitis last month. [MT]    Clinical Course User Index [MT] Shamyra Farias, Carola Rhine, MD                             Medical Decision Making Amount and/or Complexity of Data Reviewed Labs: ordered. Radiology: ordered.  Risk Prescription drug management.   This patient presents to the ED with concern for abdominal pain. This involves an extensive number of treatment options, and is a complaint that carries with it a high risk of complications and morbidity.  The differential diagnosis includes gastritis versus biliary disease versus pancreatitis versus other  Co-morbidities that complicate the patient evaluation: History of transaminitis in the past, frequent alcohol use, at high risk of complication  External records from outside source obtained and reviewed including ED visit and evaluation 1 month ago, including CT scan, right upper quadrant ultrasound, hepatitis and blood work panel.  I ordered and personally  interpreted labs.  The pertinent results include: Patient had a minor anion gap, so mild hyperglycemia.  However the remaining labs were not consistent with new onset diabetes or DKA.  A1c level was 5.8 which is prediabetic.  This was discussed with the patient.  Fortunately his lipase and his liver enzymes appear to have normalized today.  I ordered imaging studies including right upper quadrant ultrasound I independently visualized and interpreted imaging which showed no acute abnormalities I agree with the radiologist interpretation  I ordered medication including IV Dilaudid, IV Zofran, IV fluids for pain, nausea, abdominal pain  I have reviewed the patients home medicines and have made adjustments as needed  Test Considered: I do not see an indication for repeat CT imaging of the abdomen.  The patient had a benign CT scan performed 1 month ago with similar presentation.  I do not believe there is an acute surgical or infectious emergency of the abdomen at this time.  After the interventions noted above, I reevaluated the patient and found that they have: improved  Social Determinants of Health: counseled patient on alcohol cessation  Dispostion:  After consideration of the diagnostic results and the patients response to treatment, I feel that the patent would benefit from outpatient follow-up with gastroenterology         Final Clinical Impression(s) / ED Diagnoses Final diagnoses:  Nausea and vomiting, unspecified vomiting type    Rx / DC Orders ED Discharge Orders          Ordered    ondansetron (ZOFRAN-ODT) 4 MG disintegrating tablet  Every 8 hours PRN        09/23/22 2310    promethazine (PHENERGAN) 25 MG suppository  Every 8 hours PRN        09/23/22 2310    dicyclomine (BENTYL) 20 MG tablet  2 times daily PRN        09/23/22 2310    Ambulatory referral to Gastroenterology        09/23/22 2310  Wyvonnia Dusky, MD 09/23/22 [phone]

## 2022-09-23 NOTE — ED Notes (Signed)
PO challenged patient was able to take a few sips without being nauseous, MD notified

## 2022-09-23 NOTE — Discharge Instructions (Addendum)
Please stick to a liquid diet and very light feed or pures for the next 2 days.  Drink plenty of water to stay hydrated.  Use the medicine as needed for nausea and cramping abdominal pain.  He can also use heating packs on your stomach which may help with your pain.  If the pain is getting worse, or you have persistent vomiting, or you cannot keep down water, or have any other emergency concerns, please return to the ER.  Please call the gastroenterology office at the number above to schedule a follow-up appointment.  You should be seen by a specialist for this issue with your stomach.  You also had abnormal blood test with your liver enzymes 1 month ago.  Although these tests were normal today in the ER, you should still follow-up with the specialist for this issue.  In the meantime, I strongly encourage you to avoid drinking any alcohol.

## 2022-09-23 NOTE — ED Notes (Signed)
U/s at bedside

## 2022-09-23 NOTE — ED Notes (Signed)
Pt stated he was still nauseous after med admin, MD Kershaw notified

## 2022-09-24 ENCOUNTER — Telehealth: Payer: Self-pay | Admitting: *Deleted

## 2022-09-24 ENCOUNTER — Encounter: Payer: Self-pay | Admitting: Nurse Practitioner

## 2022-09-24 NOTE — Telephone Encounter (Signed)
Pt called regarding having suppository switched to P.O.  RNCM educated that he was prescribed Zofran as PO and the phenergan suppository is to be taken if he can not hold down Zofran.  Pt inquired about pain Rx.  RNCM explained that pain Rx was not sent on his last visit and he would need to be evaluated again to be prescribed pain Rx.

## 2022-09-25 ENCOUNTER — Ambulatory Visit
Admission: EM | Admit: 2022-09-25 | Discharge: 2022-09-25 | Disposition: A | Discharge status: Home / Self Care | Payer: Commercial Managed Care - HMO | Attending: Family Medicine | Admitting: Family Medicine

## 2022-09-25 DIAGNOSIS — R1013 Epigastric pain: Secondary | ICD-10-CM | POA: Diagnosis not present

## 2022-09-25 DIAGNOSIS — R112 Nausea with vomiting, unspecified: Secondary | ICD-10-CM

## 2022-09-25 LAB — POCT FASTING CBG KUC MANUAL ENTRY: POCT Glucose (KUC): 134 mg/dL — AB (ref 70–99)

## 2022-09-25 MED ORDER — ONDANSETRON 4 MG PO TBDP
4.0000 mg | ORAL_TABLET | Freq: Once | ORAL | Status: AC
  Administered 2022-09-25: 4 mg via ORAL

## 2022-09-25 MED ORDER — ONDANSETRON HCL 4 MG/2ML IJ SOLN
4.0000 mg | Freq: Once | INTRAMUSCULAR | Status: AC
  Administered 2022-09-25: 4 mg via INTRAVENOUS

## 2022-09-25 MED ORDER — LANSOPRAZOLE 30 MG PO TBDD: 30.0000 mg | DELAYED_RELEASE_TABLET | Freq: Every day | ORAL | 0 refills | Status: DC

## 2022-09-25 MED ORDER — ACETAMINOPHEN 325 MG PO TABS
975.0000 mg | ORAL_TABLET | Freq: Once | ORAL | Status: AC
  Administered 2022-09-25: 975 mg via ORAL

## 2022-09-25 MED ORDER — SODIUM CHLORIDE 0.9 % IV BOLUS
1000.0000 mL | Freq: Once | INTRAVENOUS | Status: AC
  Administered 2022-09-25: 1000 mL via INTRAVENOUS

## 2022-09-25 MED ORDER — ALUM & MAG HYDROXIDE-SIMETH 200-200-20 MG/5ML PO SUSP
30.0000 mL | Freq: Once | ORAL | Status: AC
  Administered 2022-09-25: 30 mL via ORAL

## 2022-09-25 MED ORDER — LIDOCAINE VISCOUS HCL 2 % MT SOLN
15.0000 mL | Freq: Once | OROMUCOSAL | Status: AC
  Administered 2022-09-25: 15 mL via OROMUCOSAL

## 2022-09-25 NOTE — Progress Notes (Deleted)
  Subjective:    Isaac Norris - 32 y.o. male MRN [id number]  Date of birth: 10/11/1990  HPI  Isaac Norris is to establish care.  Current issues and/or concerns: 09/25/2022 [facility]) per MD note:  ASSESSMENT & PLAN:   1. Abdominal pain, epigastric   2. Nausea and vomiting, unspecified vomiting type     Appears uncomfortable. Below treatment with mild relief. CBC/CMP/lipase pending at this time. Question stomach ulcer +/- cannabis hyperemesis syndrome given symptoms and presentation; reluctant to give IM Toradol today. Possible alcohol related gastric pain. Unable to keep PPI down over past few days. Does smoke THC regularly but has not over past several days without improvement in pain/n/v. Declines ED eval today. Has GI appt but not until the end of March. Has PCP f/u tomorrow.   Today's visit 09/26/2022: Isaac Norris appt 10/29/2022   ROS per HPI     Health Maintenance:  Health Maintenance Due  Topic Date Due   COVID-19 Vaccine (1) Never done   DTaP/Tdap/Td (1 - Tdap) Never done   INFLUENZA VACCINE  Never done     Past Medical History: There are no problems to display for this patient.     Social History   reports that he quit smoking about 9 years ago. His smoking use included cigarettes. He does not have any smokeless tobacco history on file. He reports current alcohol use of about 3.0 standard drinks of alcohol per week. He reports current drug use. Drug: Marijuana.   Family History  family history is not on file.   Medications: reviewed and updated   Objective:   Physical Exam There were no vitals taken for this visit. Physical Exam      Assessment & Plan:         Patient was given clear instructions to go to Emergency Department or return to medical center if symptoms don't improve, worsen, or new problems develop.The patient verbalized understanding.  I discussed the assessment and treatment plan with the  patient. The patient was provided an opportunity to ask questions and all were answered. The patient agreed with the plan and demonstrated an understanding of the instructions.   The patient was advised to call back or seek an in-person evaluation if the symptoms worsen or if the condition fails to improve as anticipated.    Durene Fruits, NP 09/25/2022, 3:07 PM Primary Care at [facility]

## 2022-09-25 NOTE — Discharge Instructions (Signed)
You have been seen today for abdominal pain. Your evaluation was not suggestive of any emergent condition requiring medical intervention at this time. However, some abdominal problems make take more time to appear. Therefore, it is very important for you to pay attention to any new symptoms or worsening of your current condition.  Please return here or to the Emergency Department immediately should you begin to feel worse in any way or have any of the following symptoms: increasing or different abdominal pain, persistent vomiting, inability to drink fluids, fevers, or shaking chills.   You have had labs (blood work) sent today. We will call you with any significant abnormalities or if there is need to begin or change treatment or pursue further follow up.  You may also review your test results online through [facility]. If you do not have a MyChart account, instructions to sign up should be on your discharge paperwork.

## 2022-09-25 NOTE — ED Triage Notes (Signed)
Pt c/o severe nausea starting ~ sun. States it got so severe pt called ems and seen by ED. Says "I can't catch my breath."   Says his bentyl "might make me pass out." Says does not take Prilosec b/c "I can't keep it down." Says "I only had it for a day" regarding to zofran.

## 2022-09-25 NOTE — ED Provider Notes (Addendum)
Isaac Norris   [id number] 09/25/22 Arrival Time: 0802  ASSESSMENT & PLAN:  1. Abdominal pain, epigastric   2. Nausea and vomiting, unspecified vomiting type    Appears uncomfortable. Below treatment with mild relief. CBC/CMP/lipase pending at this time. Question stomach ulcer +/- cannabis hyperemesis syndrome given symptoms and presentation; reluctant to give IM Toradol today. Possible alcohol related gastric pain. Unable to keep PPI down over past few days. Does smoke THC regularly but has not over past several days without improvement in pain/n/v. Declines ED eval today. Has GI appt but not until the end of March. Has PCP f/u tomorrow.  Meds given encounter  Medications   ondansetron (ZOFRAN-ODT) disintegrating tablet 4 mg   alum & mag hydroxide-simeth (MAALOX/MYLANTA) 200-200-20 MG/5ML suspension 30 mL   lidocaine (XYLOCAINE) 2 % viscous mouth solution 15 mL   sodium chloride 0.9 % bolus 1,000 mL   ondansetron (ZOFRAN) injection 4 mg   acetaminophen (TYLENOL) tablet 975 mg   Meds ordered this encounter  Medications   lansoprazole (PREVACID SOLUTAB) 30 MG disintegrating tablet    Sig: Take 1 tablet (30 mg total) by mouth daily.    Dispense:  30 tablet    Refill:  0   Labs Reviewed  POCT FASTING CBG [facility] MANUAL ENTRY - Abnormal; Notable for the following components:      Result Value   POCT Glucose ([facility]) 134 (*)    All other components within normal limits  CBC WITH DIFFERENTIAL/PLATELET  COMPREHENSIVE METABOLIC PANEL  LIPASE, BLOOD   Slight improvement after treatment above. To keep PCP f/u tomorrow.    Follow-up Information     [facility].   Specialty: Emergency Medicine Why: If your symptoms worsen in any way. Contact information: [address] [id number] [employer] [place] [phone]                Reviewed expectations re: course of current medical issues. Questions  answered. Outlined signs and symptoms indicating need for more acute intervention. Patient verbalized understanding. After Visit Summary given.   SUBJECTIVE: History from: patient. Isaac Norris is a 32 y.o. male who was seen in ED on 09/23/22; note reviewed. Abd U/S negative. Reports continuing epigastric abd pain; over past week; now fairly persistent without radiation. With assoc n/v; non-bloody and non-bilious. No diarrhea. Has been given PPI but unable to keep down. Bentyl without relief. Afebrile. Is ambulatory.   Social History   Substance and Sexual Activity  Alcohol Use Yes   Alcohol/week: 3.0 standard drinks of alcohol   Types: 3 Cans of beer per week   Comment: once or twice a week  Heavier drinking this past weekend.  Does smoke THC almost daily; none over past several days.  Past Surgical History:  Procedure Laterality Date   ORTHOPEDIC SURGERY Left    left arm broken twice; plate     OBJECTIVE:  Vitals:   09/25/22 0822  BP: (!) 134/90  Pulse: 77  Resp: 18  Temp: 98.4 F (36.9 C)  TempSrc: Oral  SpO2: 98%    General appearance: alert, oriented, no acute distress but appears uncomfortable HEENT: [place]; AT; oropharynx moist Lungs: unlabored respirations Abdomen: soft; without distention; mild  and poorly localized tenderness to palpation over epigastric distribution ; normal bowel sounds; without masses or organomegaly; without guarding or rebound tenderness Back: without reported CVA tenderness; FROM at waist Extremities: without LE edema; symmetrical; without gross deformities Skin: warm and dry Neurologic:  normal gait Psychological: alert and cooperative; normal mood and affect  Labs Reviewed from ED: Results for orders placed or performed during the hospital encounter of 09/23/22  Comprehensive metabolic panel  Result Value Ref Range   Sodium 133 (L) 135 - 145 mmol/L   Potassium 3.5 3.5 - 5.1 mmol/L   Chloride 97 (L) 98 - 111 mmol/L   CO2 18 (L) 22 -  32 mmol/L   Glucose, Bld 185 (H) 70 - 99 mg/dL   BUN 14 6 - 20 mg/dL   Creatinine, Ser 1.42 (H) 0.61 - 1.24 mg/dL   Calcium 9.3 8.9 - 10.3 mg/dL   Total Protein 7.4 6.5 - 8.1 g/dL   Albumin 4.6 3.5 - 5.0 g/dL   AST 45 (H) 15 - 41 U/L   ALT 30 0 - 44 U/L   Alkaline Phosphatase 58 38 - 126 U/L   Total Bilirubin 1.4 (H) 0.3 - 1.2 mg/dL   GFR, Estimated >60 >60 mL/min   Anion gap 18 (H) 5 - 15  CBC with Differential  Result Value Ref Range   WBC 6.7 4.0 - 10.5 K/uL   RBC 5.38 4.22 - 5.81 MIL/uL   Hemoglobin 16.8 13.0 - 17.0 g/dL   HCT 46.8 39.0 - 52.0 %   MCV 87.0 80.0 - 100.0 fL   MCH 31.2 26.0 - 34.0 pg   MCHC 35.9 30.0 - 36.0 g/dL   RDW 13.4 11.5 - 15.5 %   Platelets 249 150 - 400 K/uL   nRBC 0.0 0.0 - 0.2 %   Neutrophils Relative % 80 %   Neutro Abs 5.4 1.7 - 7.7 K/uL   Lymphocytes Relative 13 %   Lymphs Abs 0.9 0.7 - 4.0 K/uL   Monocytes Relative 6 %   Monocytes Absolute 0.4 0.1 - 1.0 K/uL   Eosinophils Relative 0 %   Eosinophils Absolute 0.0 0.0 - 0.5 K/uL   Basophils Relative 0 %   Basophils Absolute 0.0 0.0 - 0.1 K/uL   Immature Granulocytes 1 %   Abs Immature Granulocytes 0.06 0.00 - 0.07 K/uL  Lipase, blood  Result Value Ref Range   Lipase 39 11 - 51 U/L  Beta-hydroxybutyric acid  Result Value Ref Range   Beta-Hydroxybutyric Acid 0.20 0.05 - 0.27 mmol/L  Hemoglobin A1c  Result Value Ref Range   Hgb A1c MFr Bld 5.8 (H) 4.8 - 5.6 %   Mean Plasma Glucose 119.76 mg/dL  I-Stat venous blood gas, ([facility], [facility], [facility])  Result Value Ref Range   pH, Ven 7.416 7.25 - 7.43   pCO2, Ven 35.0 (L) 44 - 60 mmHg   pO2, Ven 45 32 - 45 mmHg   Bicarbonate 22.5 20.0 - 28.0 mmol/L   TCO2 24 22 - 32 mmol/L   O2 Saturation 82 %   Acid-base deficit 1.0 0.0 - 2.0 mmol/L   Sodium 137 135 - 145 mmol/L   Potassium 4.0 3.5 - 5.1 mmol/L   Calcium, Ion 1.06 (L) 1.15 - 1.40 mmol/L   HCT 43.0 39.0 - 52.0 %   Hemoglobin 14.6 13.0 - 17.0 g/dL   Sample type VENOUS    Labs Reviewed  CBC  WITH DIFFERENTIAL/PLATELET  COMPREHENSIVE METABOLIC PANEL  LIPASE, BLOOD    Imaging: No results found.   No Known Allergies  Past Medical History:  Diagnosis Date   Hypertension     Social History   Socioeconomic History   Marital status: Single    Spouse name: Not on file   Number of children: Not on file   Years of education: Not on file   Highest education level: Not on file  Occupational History   Not on file  Tobacco Use   Smoking status: Former    Types: Cigarettes    Quit date: 04/01/2013    Years since quitting: 9.4   Smokeless tobacco: Not on file  Substance and Sexual Activity   Alcohol use: Yes    Alcohol/week: 3.0 standard drinks of alcohol    Types: 3 Cans of beer per week    Comment: once or twice a week   Drug use: Yes    Types: Marijuana    Comment: daily   Sexual activity: Not on file  Other Topics Concern   Not on file  Social History Narrative   Not on file   Social Determinants of Health   Financial Resource Strain: Not on file  Food Insecurity: Not on file  Transportation Needs: Not on file  Physical Activity: Not on file  Stress: Not on file  Social Connections: Not on file  Intimate Partner Violence: Not on file    History reviewed. No pertinent family history.   Vanessa Kick, MD 09/25/22 1042    Vanessa Kick, MD 09/25/22 [phone]

## 2022-09-26 ENCOUNTER — Ambulatory Visit: Payer: Commercial Managed Care - HMO | Admitting: Family

## 2022-09-26 DIAGNOSIS — R112 Nausea with vomiting, unspecified: Secondary | ICD-10-CM

## 2022-09-26 DIAGNOSIS — R1013 Epigastric pain: Secondary | ICD-10-CM

## 2022-09-26 DIAGNOSIS — Z7689 Persons encountering health services in other specified circumstances: Secondary | ICD-10-CM

## 2022-09-26 LAB — CBC WITH DIFFERENTIAL/PLATELET
Basophils Absolute: 0 10*3/uL (ref 0.0–0.2)
Basos: 0 %
EOS (ABSOLUTE): 0 10*3/uL (ref 0.0–0.4)
Eos: 0 %
Hematocrit: 45.9 % (ref 37.5–51.0)
Hemoglobin: 16.2 g/dL (ref 13.0–17.7)
Immature Grans (Abs): 0 10*3/uL (ref 0.0–0.1)
Immature Granulocytes: 0 %
Lymphocytes Absolute: 1.3 10*3/uL (ref 0.7–3.1)
Lymphs: 25 %
MCH: 31.5 pg (ref 26.6–33.0)
MCHC: 35.3 g/dL (ref 31.5–35.7)
MCV: 89 fL (ref 79–97)
Monocytes Absolute: 0.5 10*3/uL (ref 0.1–0.9)
Monocytes: 10 %
Neutrophils Absolute: 3.3 10*3/uL (ref 1.4–7.0)
Neutrophils: 65 %
Platelets: 187 10*3/uL (ref 150–450)
RBC: 5.14 x10E6/uL (ref 4.14–5.80)
RDW: 14.5 % (ref 11.6–15.4)
WBC: 5.1 10*3/uL (ref 3.4–10.8)

## 2022-09-26 LAB — COMPREHENSIVE METABOLIC PANEL
ALT: 25 IU/L (ref 0–44)
AST: 41 IU/L — ABNORMAL HIGH (ref 0–40)
Albumin/Globulin Ratio: 1.9 (ref 1.2–2.2)
Albumin: 5.1 g/dL (ref 4.1–5.1)
Alkaline Phosphatase: 59 IU/L (ref 44–121)
BUN/Creatinine Ratio: 10 (ref 9–20)
BUN: 13 mg/dL (ref 6–20)
Bilirubin Total: 1.2 mg/dL (ref 0.0–1.2)
CO2: 18 mmol/L — ABNORMAL LOW (ref 20–29)
Calcium: 11 mg/dL — ABNORMAL HIGH (ref 8.7–10.2)
Chloride: 103 mmol/L (ref 96–106)
Creatinine, Ser: 1.25 mg/dL (ref 0.76–1.27)
Globulin, Total: 2.7 g/dL (ref 1.5–4.5)
Glucose: 112 mg/dL — ABNORMAL HIGH (ref 70–99)
Sodium: 139 mmol/L (ref 134–144)
Total Protein: 7.8 g/dL (ref 6.0–8.5)
eGFR: 79 mL/min/{1.73_m2} (ref >59)

## 2022-09-27 ENCOUNTER — Telehealth: Payer: Commercial Managed Care - HMO | Admitting: Physician Assistant

## 2022-09-27 DIAGNOSIS — N529 Male erectile dysfunction, unspecified: Secondary | ICD-10-CM

## 2022-09-27 DIAGNOSIS — K2900 Acute gastritis without bleeding: Secondary | ICD-10-CM | POA: Diagnosis not present

## 2022-09-27 DIAGNOSIS — A749 Chlamydial infection, unspecified: Secondary | ICD-10-CM

## 2022-09-27 MED ORDER — SUCRALFATE 1 G PO TABS: 1.0000 g | ORAL_TABLET | Freq: Three times a day (TID) | ORAL | 0 refills | Status: AC

## 2022-09-27 NOTE — Patient Instructions (Signed)
Isa Rankin, thank you for joining Mar Daring, PA-C for today's virtual visit.  While this provider is not your primary care provider (PCP), if your PCP is located in our provider database this encounter information will be shared with them immediately following your visit.   [facility] account gives you access to today's visit and all your visits, tests, and labs performed at [facility] " click here if you don't have a [facility] account or go to [email or web address]  Consent: (Patient) Binyamin Klym provided verbal consent for this virtual visit at the beginning of the encounter.  Current Medications:  Current Outpatient Medications:    sucralfate (CARAFATE) 1 g tablet, Take 1 tablet (1 g total) by mouth 4 (four) times daily -  with meals and at bedtime., Disp: 40 tablet, Rfl: 0   dicyclomine (BENTYL) 20 MG tablet, Take 1 tablet (20 mg total) by mouth 2 (two) times daily as needed for up to 14 doses for spasms., Disp: 14 tablet, Rfl: 0   lansoprazole (PREVACID SOLUTAB) 30 MG disintegrating tablet, Take 1 tablet (30 mg total) by mouth daily., Disp: 30 tablet, Rfl: 0   lisinopril-hydrochlorothiazide (ZESTORETIC) 20-25 MG tablet, Take 1 tablet by mouth daily., Disp: , Rfl:    ondansetron (ZOFRAN-ODT) 4 MG disintegrating tablet, Take 1 tablet (4 mg total) by mouth every 8 (eight) hours as needed for up to 15 doses for nausea or vomiting., Disp: 15 tablet, Rfl: 0   ondansetron (ZOFRAN-ODT) 8 MG disintegrating tablet, Take 1 tablet (8 mg total) by mouth every 8 (eight) hours as needed for nausea or vomiting., Disp: 20 tablet, Rfl: 0   promethazine (PHENERGAN) 25 MG suppository, Place 1 suppository (25 mg total) rectally every 8 (eight) hours as needed for up to 6 doses for nausea or vomiting., Disp: 6 each, Rfl: 0   Medications ordered in this encounter:  Meds ordered this encounter  Medications   sucralfate (CARAFATE) 1 g tablet    Sig: Take 1  tablet (1 g total) by mouth 4 (four) times daily -  with meals and at bedtime.    Dispense:  40 tablet    Refill:  0    Order Specific Question:   Supervising Provider    Answer:   Chase Picket [id number]     *If you need refills on other medications prior to your next appointment, please contact your pharmacy*  Follow-Up: Call back or seek an in-person evaluation if the symptoms worsen or if the condition fails to improve as anticipated.  [place] [phone]  Other Instructions  KEEP scheduled GI appt on 10/29/22  Gastritis, Adult Gastritis is inflammation of the stomach. There are two kinds of gastritis: Acute gastritis. This kind develops suddenly. Chronic gastritis. This kind is much more common. It develops slowly and lasts for a long time. Gastritis happens when the lining of the stomach becomes weak or gets damaged. Without treatment, gastritis can lead to stomach bleeding and ulcers. What are the causes? This condition may be caused by: An infection. Drinking too much alcohol. Certain medicines. These include steroids, antibiotics, and some over-the-counter medicines, such as aspirin or ibuprofen. Having too much acid in the stomach. Having a disease of the stomach. Other causes may include: An allergic reaction. Some cancer treatments (radiation). Smoking cigarettes or the use of products that contain nicotine or tobacco. In some cases, the cause of this condition is not known. What increases the risk? Having a disease  of the intestines. Having a disease in which the body's immune system attacks the body (autoimmune disease), such as Crohn's disease. Using aspirin or ibuprofen and other NSAIDs to treat other conditions, such as heart disease or chronic pain. Stress. What are the signs or symptoms? Symptoms of this condition include: Pain or a burning sensation in the upper abdomen. Nausea. Vomiting. An uncomfortable feeling of fullness  after eating. Weight loss. Bad breath. Blood in your vomit or stool (feces). In some cases, there are no symptoms. How is this diagnosed? This condition may be diagnosed based on your medical history, a physical exam, and tests. Tests may include: Your medical history and a description of your symptoms. A physical exam. Tests. These can include: Blood tests. Stool tests. A test in which a thin, flexible instrument with a light and a camera is passed down the esophagus and into the stomach (upper endoscopy). A test in which a tissue sample is removed to look at it under a microscope (biopsy). How is this treated? This condition may be treated with medicines. The medicines that are used vary depending on the cause of the gastritis. If the condition is caused by a bacterial infection, you may be given antibiotic medicines. If the condition is caused by too much acid in the stomach, you may be given medicines called H2 blockers, proton pump inhibitors, or antacids. Treatment may also involve stopping the use of certain medicines such as aspirin or ibuprofen and other NSAIDs. Follow these instructions at home: Medicines Take over-the-counter and prescription medicines only as told by your health care provider. If you were prescribed an antibiotic medicine, take it as told by your health care provider. Do not stop taking the antibiotic even if you start to feel better. Alcohol use Do not drink alcohol if: Your health care provider tells you not to drink. You are pregnant, may be pregnant, or are planning to become pregnant. If you drink alcohol: Limit your use to: 0-1 drink a day for women. 0-2 drinks a day for men. Know how much alcohol is in your drink. In the [place] one drink equals one 12 oz bottle of beer (355 mL), one 5 oz glass of wine (148 mL), or one 1 oz glass of hard liquor (44 mL). General instructions  Eat small, frequent meals instead of large meals. Avoid foods and drinks  that make your symptoms worse. Talk with your health care provider about ways to manage stress, such as getting regular exercise or practicing deep breathing, meditation, or yoga. Do not use any products that contain nicotine or tobacco. These products include cigarettes, chewing tobacco, and vaping devices, such as e-cigarettes. If you need help quitting, ask your health care provider. Drink enough fluid to keep your urine pale yellow. Keep all follow-up visits. This is important. Contact a health care provider if: Your symptoms get worse. Your abdominal pain gets worse. Your symptoms return after treatment. You have a fever. Get help right away if: You vomit blood or a substance that looks like coffee grounds. You have black or dark red stools. You are unable to keep fluids down. These symptoms may represent a serious problem that is an emergency. Do not wait to see if the symptoms will go away. Get medical help right away. Call your local emergency services (911 in the [place] Do not drive yourself to the hospital. Summary Gastritis is inflammation of the lining of the stomach that can occur suddenly (acute) or develop slowly over time (chronic).  This condition is diagnosed with a medical history, a physical exam, or tests. This condition may be treated with medicines to treat infection or medicines to reduce the amount of acid in your stomach. Follow your health care provider's instructions about taking medicines, making changes to your diet, and knowing when to call for help. This information is not intended to replace advice given to you by your health care provider. Make sure you discuss any questions you have with your health care provider. Document Revised: 11/25/2020 Document Reviewed: 11/25/2020 Elsevier Patient Education  [place].    If you have been instructed to have an in-person evaluation today at a local Urgent Care facility, please use the link below. It will  take you to a list of all of our available [facility], including address, phone number and hours of operation. Please do not delay care.  [facility]  If you or a family member do not have a primary care provider, use the link below to schedule a visit and establish care. When you choose a [facility] primary care physician or advanced practice provider, you gain a long-term partner in health. Find a Primary Care Provider  Learn more about 's in-office and virtual care options: [facility]

## 2022-09-27 NOTE — Progress Notes (Signed)
Virtual Visit Consent   Isaac Norris, you are scheduled for a virtual visit with a [facility] provider today. Just as with appointments in the office, your consent must be obtained to participate. Your consent will be active for this visit and any virtual visit you may have with one of our providers in the next 365 days. If you have a MyChart account, a copy of this consent can be sent to you electronically.  As this is a virtual visit, video technology does not allow for your provider to perform a traditional examination. This may limit your provider's ability to fully assess your condition. If your provider identifies any concerns that need to be evaluated in person or the need to arrange testing (such as labs, EKG, etc.), we will make arrangements to do so. Although advances in technology are sophisticated, we cannot ensure that it will always work on either your end or our end. If the connection with a video visit is poor, the visit may have to be switched to a telephone visit. With either a video or telephone visit, we are not always able to ensure that we have a secure connection.  By engaging in this virtual visit, you consent to the provision of healthcare and authorize for your insurance to be billed (if applicable) for the services provided during this visit. Depending on your insurance coverage, you may receive a charge related to this service.  I need to obtain your verbal consent now. Are you willing to proceed with your visit today? Samanyu Lathan has provided verbal consent on 09/27/2022 for a virtual visit (video or telephone). Mar Daring, PA-C  Date: 09/27/2022 10:44 AM  Virtual Visit via Video Note   I, Mar Daring, connected with  Isaac Norris  ([id number], [birth year]) on 09/27/22 at  9:15 AM EST by a video-enabled telemedicine application and verified that I am speaking with the correct person using two identifiers.  Location: Patient: Virtual Visit Location Patient:  Home Provider: Virtual Visit Location Provider: Home Office   I discussed the limitations of evaluation and management by telemedicine and the availability of in person appointments. The patient expressed understanding and agreed to proceed.    History of Present Illness: Isaac Norris is a 32 y.o. who identifies as a male who was assigned male at birth, and is being seen today for continued abdominal pain, concerns for continued STI, and ED.  On 08/28/22 he was seen at [facility] for abdominal pain and dysuria. Was found to have transaminitis with AST 789 and ALT 148, and chlamydia. Doxycycline '100mg'$  BID x 7 days was prescribed and completed for patient.   He returned to the ER on 09/01/22 due to the elevated liver enzymes. Had Abdominal CT and Abdominal US, both negative. Hepatic panel rechecked and ALT was down to 572, but ALT was up to 197. Lipase mildly elevated at 61. At this time was given Omeprazole for possible GERD/Gastritis causing the transaminitis.   Returned to ER again on 09/23/22 due to nausea and vomiting. Labs at this time showed improvement/resolution of Transaminitis with ALT down to 30 and AST down to 45. Did have a small Anion gap of 18. Work up for DKA was initiated and was negative, A1c slightly elevated at 5.8. No acute kidney injury noted. Lipase down to 39. Was given Promethazine '25mg'$  supp and Zofran '4mg'$  for nausea. Dicyclomine '20mg'$  BID for pain and cramping. Referral to GI placed, question if patient having Gastroparesis.  Patient then called on 09/24/22  and inquired about pain medication via telephone. Advised none had been sent and would require re-evaluation if still having pain.   Returned again to UC on 09/25/22 for continued abdominal pain and nausea and vomiting. At this time was given Toradol IM in person for pain. Reported unable to keep Omeprazole down so was changed to lansoprazole solutab '30mg'$  daily. Again, liver enzymes continued to improve with ALT down to 25 and AST  down to 41. Provider questioning possible gastric ulcer vs Cannabis hyperemesis syndrome (patient reported symptoms did not change despite not smoking cannabis over last 3 days) vs alcohol related gastritis.   Patient was scheduled yesterday, 09/26/22, to establish with Primary Care office but either no showed or canceled less than 24 hours in advance. Now appt is not until 12/17/22.  GI appt is scheduled on 10/29/22.   Still having some dysuria and erectile dysfunction as well. Curious if chlamydia was fully treated.    Problems: There are no problems to display for this patient.   Allergies: No Known Allergies Medications:  Current Outpatient Medications:    sucralfate (CARAFATE) 1 g tablet, Take 1 tablet (1 g total) by mouth 4 (four) times daily -  with meals and at bedtime., Disp: 40 tablet, Rfl: 0   dicyclomine (BENTYL) 20 MG tablet, Take 1 tablet (20 mg total) by mouth 2 (two) times daily as needed for up to 14 doses for spasms., Disp: 14 tablet, Rfl: 0   lansoprazole (PREVACID SOLUTAB) 30 MG disintegrating tablet, Take 1 tablet (30 mg total) by mouth daily., Disp: 30 tablet, Rfl: 0   lisinopril-hydrochlorothiazide (ZESTORETIC) 20-25 MG tablet, Take 1 tablet by mouth daily., Disp: , Rfl:    ondansetron (ZOFRAN-ODT) 4 MG disintegrating tablet, Take 1 tablet (4 mg total) by mouth every 8 (eight) hours as needed for up to 15 doses for nausea or vomiting., Disp: 15 tablet, Rfl: 0   ondansetron (ZOFRAN-ODT) 8 MG disintegrating tablet, Take 1 tablet (8 mg total) by mouth every 8 (eight) hours as needed for nausea or vomiting., Disp: 20 tablet, Rfl: 0   promethazine (PHENERGAN) 25 MG suppository, Place 1 suppository (25 mg total) rectally every 8 (eight) hours as needed for up to 6 doses for nausea or vomiting., Disp: 6 each, Rfl: 0  Observations/Objective: Patient is well-developed, well-nourished in no acute distress.  Resting comfortably at home.  Head is normocephalic, atraumatic.  No  labored breathing.  Speech is clear and coherent with logical content.  Patient is alert and oriented at baseline.    Assessment and Plan: 1. Other acute gastritis without hemorrhage - sucralfate (CARAFATE) 1 g tablet; Take 1 tablet (1 g total) by mouth 4 (four) times daily -  with meals and at bedtime.  Dispense: 40 tablet; Refill: 0  2. Chlamydia  3. Erectile dysfunction, unspecified erectile dysfunction type  - Will add Sucralfate for gastritis - Again, patient asked for pain medication but advised against due to recent Transaminitis (avoid Tylenol) and suspected gastritis (NSAIDs worsen) - Continue Bentyl and Lansoprazole - Continue Zofran as needed for nausea - Keep scheduled GI follow up on 10/29/22 - Advised to follow up in person at [facility] for test of cure from recent chlamydial infection, discussed could still have residual inflammation, but could test to make sure not still positive. - Seek in person evaluation if continues to worsen   Follow Up Instructions: I discussed the assessment and treatment plan with the patient. The patient was provided an opportunity to  ask questions and all were answered. The patient agreed with the plan and demonstrated an understanding of the instructions.  A copy of instructions were sent to the patient via MyChart unless otherwise noted below.    The patient was advised to call back or seek an in-person evaluation if the symptoms worsen or if the condition fails to improve as anticipated.  Time:  I spent 20 minutes with the patient via telehealth technology discussing the above problems/concerns.    Mar Daring, PA-C

## 2022-09-30 ENCOUNTER — Ambulatory Visit (INDEPENDENT_AMBULATORY_CARE_PROVIDER_SITE_OTHER): Payer: Commercial Managed Care - HMO

## 2022-09-30 ENCOUNTER — Ambulatory Visit
Admission: EM | Admit: 2022-09-30 | Discharge: 2022-09-30 | Disposition: A | Discharge status: Home / Self Care | Payer: Commercial Managed Care - HMO | Attending: Nurse Practitioner | Admitting: Nurse Practitioner

## 2022-09-30 DIAGNOSIS — Z1152 Encounter for screening for COVID-19: Secondary | ICD-10-CM | POA: Insufficient documentation

## 2022-09-30 DIAGNOSIS — R0602 Shortness of breath: Secondary | ICD-10-CM | POA: Diagnosis present

## 2022-09-30 DIAGNOSIS — R300 Dysuria: Secondary | ICD-10-CM

## 2022-09-30 LAB — POCT URINALYSIS DIP (MANUAL ENTRY)
Bilirubin, UA: NEGATIVE
Blood, UA: NEGATIVE
Glucose, UA: NEGATIVE mg/dL
Leukocytes, UA: NEGATIVE
Nitrite, UA: NEGATIVE
Protein Ur, POC: 30 mg/dL — AB
Spec Grav, UA: 1.02 (ref 1.010–1.025)
Urobilinogen, UA: 1 E.U./dL
pH, UA: 7 (ref 5.0–8.0)

## 2022-09-30 MED ORDER — LISINOPRIL-HYDROCHLOROTHIAZIDE 20-25 MG PO TABS: 1.0000 | ORAL_TABLET | Freq: Every day | ORAL | 0 refills | Status: AC

## 2022-09-30 MED ORDER — HYDROXYZINE HCL 10 MG PO TABS: 10.0000 mg | ORAL_TABLET | Freq: Three times a day (TID) | ORAL | 0 refills | Status: AC | PRN

## 2022-09-30 MED ORDER — AZITHROMYCIN 500 MG PO TABS
1000.0000 mg | ORAL_TABLET | Freq: Once | ORAL | Status: AC
  Administered 2022-09-30: 1000 mg via ORAL

## 2022-09-30 NOTE — ED Triage Notes (Signed)
Pt c/o being treat for chlamydia states sxs never resolved now having sore/painful scrotum, inability to obtain erection, lower abd pain, dark colored urine

## 2022-09-30 NOTE — Discharge Instructions (Addendum)
Your chest xray is negative. Your swabs are pending. You have been given Azithromycin 1000 mg; empirical treatment.   Your COVID is pending. Your results will show in your MyChart. Any positive results will result in a phone call from a nurse with next steps in treatment and recommendations.   Zestoretic 20/25 mg qd refilled for your Blood pressure Hydroxyzine 10 mg every 8 hours for nausea and anxiety

## 2022-09-30 NOTE — ED Provider Notes (Addendum)
[facility]    CSN: [id number] Arrival date & time: 09/30/22  1438      History   Chief Complaint Chief Complaint  Patient presents with   problems    HPI Isaac Norris is a 32 y.o. male.   HPI  He is in today for a follow up. He was tested positive for chlamydia and was treated Doxycyline which he reports that he completed as directed. He reports that he is concern because he has had unprotected intercourse again with both persons. He is not sure if either of them are positive for Chlamydia. He reports that he is now having problems obtaining an erection. He is having shortness of breath, nausea, abdominal pain and dark urine. He reports that his is concern and anxious. He is also requesting a refill on his BP medication. He denies any chest pain, fever, chills or nasal congestion.  Past Medical History:  Diagnosis Date   Hypertension     There are no problems to display for this patient.   Past Surgical History:  Procedure Laterality Date   ORTHOPEDIC SURGERY Left    left arm broken twice; plate       Home Medications    Prior to Admission medications   Medication Sig Start Date End Date Taking? Authorizing Provider  hydrOXYzine (ATARAX) 10 MG tablet Take 1 tablet (10 mg total) by mouth 3 (three) times daily as needed for up to 10 days for anxiety or nausea. 09/30/22 10/10/22 Yes Vevelyn Francois, NP  dicyclomine (BENTYL) 20 MG tablet Take 1 tablet (20 mg total) by mouth 2 (two) times daily as needed for up to 14 doses for spasms. 09/23/22   Wyvonnia Dusky, MD  lansoprazole (PREVACID SOLUTAB) 30 MG disintegrating tablet Take 1 tablet (30 mg total) by mouth daily. 09/25/22   Vanessa Kick, MD  lisinopril-hydrochlorothiazide (ZESTORETIC) 20-25 MG tablet Take 1 tablet by mouth daily. 09/30/22 10/30/22  Vevelyn Francois, NP  sucralfate (CARAFATE) 1 g tablet Take 1 tablet (1 g total) by mouth 4 (four) times daily -  with meals and at bedtime. 09/27/22   Mar Daring, PA-C    Family History History reviewed. No pertinent family history.  Social History Social History   Tobacco Use   Smoking status: Former    Types: Cigarettes    Quit date: 04/01/2013    Years since quitting: 9.5  Substance Use Topics   Alcohol use: Yes    Alcohol/week: 3.0 standard drinks of alcohol    Types: 3 Cans of beer per week    Comment: once or twice a week   Drug use: Yes    Types: Marijuana    Comment: daily     Allergies   Patient has no known allergies.   Review of Systems Review of Systems   Physical Exam Triage Vital Signs ED Triage Vitals [09/30/22 1625]  Enc Vitals Group     BP 128/77     Pulse Rate 99     Resp 16     Temp 100 F (37.8 C)     Temp Source Oral     SpO2 99 %     Weight      Height      Head Circumference      Peak Flow      Pain Score 5     Pain Loc      Pain Edu?      Excl. in [place]?  No data found.  Updated Vital Signs BP 128/77 (BP Location: Left Arm)   Pulse 99   Temp 100 F (37.8 C) (Oral)   Resp 16   SpO2 99%   Visual Acuity Right Eye Distance:   Left Eye Distance:   Bilateral Distance:    Right Eye Near:   Left Eye Near:    Bilateral Near:     Physical Exam Constitutional:      Appearance: He is normal weight. He is not ill-appearing, toxic-appearing or diaphoretic.  HENT:     Head: Normocephalic and atraumatic.     Nose: Nose normal.     Mouth/Throat:     Mouth: Mucous membranes are moist.  Cardiovascular:     Rate and Rhythm: Normal rate and regular rhythm.     Pulses: Normal pulses.     Heart sounds: Normal heart sounds.  Pulmonary:     Effort: Pulmonary effort is normal.     Breath sounds: Normal breath sounds.  Musculoskeletal:        General: Normal range of motion.     Cervical back: Normal range of motion.  Skin:    General: Skin is warm and dry.     Capillary Refill: Capillary refill takes less than 2 seconds.  Neurological:     General: No focal deficit present.      Mental Status: He is alert and oriented to person, place, and time.  Psychiatric:     Comments: anxious      UC Treatments / Results  Labs (all labs ordered are listed, but only abnormal results are displayed) Labs Reviewed  POCT URINALYSIS DIP (MANUAL ENTRY) - Abnormal; Notable for the following components:      Result Value   Color, UA orange (*)    Ketones, POC UA trace (5) (*)    Protein Ur, POC =30 (*)    All other components within normal limits  SARS CORONAVIRUS 2 (TAT 6-24 HRS)  CYTOLOGY, (ORAL, ANAL, URETHRAL) ANCILLARY ONLY    EKG   Radiology No results found.  Procedures Procedures (including critical care time)  Medications Ordered in UC Medications  azithromycin (ZITHROMAX) tablet 1,000 mg (1,000 mg Oral Given 09/30/22 1728)    Initial Impression / Assessment and Plan / UC Course  I have reviewed the triage vital signs and the nursing notes.  Pertinent labs & imaging results that were available during my care of the patient were reviewed by me and considered in my medical decision making (see chart for details).     Follow up Final Clinical Impressions(s) / UC Diagnoses   Final diagnoses:  SOB (shortness of breath)     Discharge Instructions      Your chest xray is negative. Your swabs are pending. You have been given Azithromycin 1000 mg empiric treatment.   Your COVID is pending. Your results will show in your MyChart. Any positive results will result in a phone call from a nurse with next steps in treatment and recommendations.  Discussed STI and protective intercourse and high risk behavior.  Zestoretic 20/25 mg qd refilled for your Blood pressure Hydroxyzine 10 mg every 8 hours for nausea and anxiety       ED Prescriptions     Medication Sig Dispense Auth. Provider   lisinopril-hydrochlorothiazide (ZESTORETIC) 20-25 MG tablet Take 1 tablet by mouth daily. 30 tablet Vevelyn Francois, NP   hydrOXYzine (ATARAX) 10 MG tablet Take 1  tablet (10 mg total) by mouth 3 (three) times  daily as needed for up to 10 days for anxiety or nausea. 30 tablet Vevelyn Francois, NP      PDMP not reviewed this encounter.   Vevelyn Francois, NP 09/30/22 1725    Vevelyn Francois, NP 10/04/22 [phone]

## 2022-10-01 LAB — CYTOLOGY, (ORAL, ANAL, URETHRAL) ANCILLARY ONLY
Chlamydia: NEGATIVE
Comment: NEGATIVE
Comment: NEGATIVE
Comment: NORMAL
Neisseria Gonorrhea: NEGATIVE
Trichomonas: NEGATIVE

## 2022-10-01 LAB — SARS CORONAVIRUS 2 (TAT 6-24 HRS): SARS Coronavirus 2: NEGATIVE

## 2022-10-29 ENCOUNTER — Encounter: Payer: Self-pay | Admitting: Nurse Practitioner

## 2022-10-29 ENCOUNTER — Ambulatory Visit: Payer: Commercial Managed Care - HMO | Admitting: Nurse Practitioner

## 2022-10-29 VITALS — BP 122/78 | HR 78 | Ht 73.0 in | Wt 225.0 lb

## 2022-10-29 DIAGNOSIS — K219 Gastro-esophageal reflux disease without esophagitis: Secondary | ICD-10-CM

## 2022-10-29 MED ORDER — FAMOTIDINE 20 MG PO TABS: 20.0000 mg | ORAL_TABLET | Freq: Every day | ORAL | 5 refills | Status: AC

## 2022-10-29 MED ORDER — OMEPRAZOLE 20 MG PO CPDR: 20.0000 mg | DELAYED_RELEASE_CAPSULE | Freq: Every day | ORAL | 5 refills | Status: AC

## 2022-10-29 NOTE — Progress Notes (Signed)
Assessment:    32 yo male with the following:   Chronic GERD with heartburn and regurgitation Partial improvement with daily Omeprazole  Recent elevation in liver chemistries with associated N/V/generalized abdominal pain.   [place] x2 and CT scan unrevealing Etoh may have been contributing.  Has stopped drinking. Symptoms have resolved. Liver chemistries normal except for trivial elevation in AST.   Plan:   -Discussed anti-reflux measures such as avoidance of late meals / bedtime snacks, HOB elevation (or use of wedge pillow), weight reduction ( if applicable)  / maintaining a healthy BMI ( body mass index),  and avoidance of trigger foods and caffeine.  -Continue Omeprazole 20 mg before breakfast -Add famotidine 20 mg at bedtime  -Schedule for EGD. The risks and benefits of EGD with possible biopsies were discussed with the patient who agrees to proceed.  -Follow up with me after EGD. If doing better with anti-reflux measures than can possibly get him off PPI.     History of Present Illness   Chief Complaint: acid reflux  Isaac Norris is a 32 y.o. year old male with a past medical history of THC use, HTN and GERD. See PMH / Isaac Norris for additional history.   Isaac Norris is being seen at the request of Dr. Langston Masker with [facility]. Patient was seen at Urgent Care and the ED in January and February for abdominal pain / N/V. Symptoms lasted several weeks.  Of note he had been drinking heavily Dec-January.  ED labs notable for elevated liver chemistries, mildly elevated lipase   Liver chemistries have since nearly normalized.  ALT 789 >> 572> 45>> 41 AST 148>> 197>> 30>> 25 Tbili 1.4>> 1.2 Acute hepatitis panel negative.  RUQ and abdominal US negative CT scan  unremarkable.   Interval History : Isaac Norris is no longer having the abdominal pain, N/V and his bowel movements are normal. He stopped drinking Etoh and made some changes to his diet.  He is however having acid reflux.  Isaac Norris gives a long standing history of acid reflux with heartburn and regurgitation, especially at night. Started omeprazole 20 mg a couple of months ago It does help but still gets frequent symptoms. He is avoiding citrus and spicy food. Other than avoiding trigger foods he isn't following any anti-reflux measures.    Previous Labs / Imaging::    Latest Ref Rng & Units 09/25/2022    9:31 AM 09/23/2022    8:18 PM 09/23/2022    4:57 PM  CBC  WBC 3.4 - 10.8 x10E3/uL 5.1   6.7   Hemoglobin 13.0 - 17.7 g/dL 16.2  14.6  16.8   Hematocrit 37.5 - 51.0 % 45.9  43.0  46.8   Platelets 150 - 450 x10E3/uL 187   249     Lab Results  Component Value Date   LIPASE 39 09/23/2022      Latest Ref Rng & Units 09/25/2022    9:31 AM 09/23/2022    8:18 PM 09/23/2022    4:57 PM  CMP  Glucose 70 - 99 mg/dL 112   185   BUN 6 - 20 mg/dL 13   14   Creatinine 0.76 - 1.27 mg/dL 1.25   1.42   Sodium 134 - 144 mmol/L 139  137  133   Potassium mmol/L CANCELED  4.0  3.5   Chloride 96 - 106 mmol/L 103   97   CO2 20 - 29 mmol/L 18   18   Calcium 8.7 -  10.2 mg/dL 11.0   9.3   Total Protein 6.0 - 8.5 g/dL 7.8   7.4   Total Bilirubin 0.0 - 1.2 mg/dL 1.2   1.4   Alkaline Phos 44 - 121 IU/L 59   58   AST 0 - 40 IU/L 41   45   ALT 0 - 44 IU/L 25   30      Imaging:  DG Chest 2 View CLINICAL DATA:  Provided history: Shortness of breath.  EXAM: CHEST - 2 VIEW  COMPARISON:  No pertinent prior exams available for comparison.  FINDINGS: Heart size within normal limits. No appreciable airspace consolidation. No evidence of pleural effusion or pneumothorax. No acute osseous abnormality identified.  IMPRESSION: No evidence of active cardiopulmonary disease.  Electronically Signed   By: Kellie Simmering D.O.   On: 09/30/2022 17:00    Past Medical History:  Diagnosis Date   Hypertension    Past Surgical History:  Procedure Laterality Date   ORTHOPEDIC SURGERY Left    left arm broken twice; plate   No  family history on file. Social History   Tobacco Use   Smoking status: Former    Types: Cigarettes    Quit date: 04/01/2013    Years since quitting: 9.5  Substance Use Topics   Alcohol use: Yes    Alcohol/week: 3.0 standard drinks of alcohol    Types: 3 Cans of beer per week    Comment: once or twice a week   Drug use: Yes    Types: Marijuana    Comment: daily   Current Outpatient Medications  Medication Sig Dispense Refill   lisinopril-hydrochlorothiazide (ZESTORETIC) 20-25 MG tablet Take 1 tablet by mouth daily. 30 tablet 0   omeprazole (PRILOSEC) 20 MG capsule Take 20 mg by mouth daily.     No current facility-administered medications for this visit.   No Known Allergies   Review of Systems: Positive for anxiety, back pain, shortness of breath, sleeping problems.  All other systems reviewed and negative except where noted in HPI.   Wt Readings from Last 3 Encounters:  09/23/22 230 lb (104.3 kg)  04/01/14 226 lb (102.5 kg)    Physical Exam:  BP 122/78   Pulse 78   Ht 6\' 1"  (1.854 m)   Wt 225 lb (102.1 kg)   BMI 29.69 kg/m  Constitutional:  Pleasant, generally well appearing male in no acute distress. Psychiatric:  Normal mood and affect. Behavior is normal. EENT: Pupils normal.  Conjunctivae are normal. No scleral icterus. Neck supple.  Cardiovascular: Normal rate, regular rhythm.  Pulmonary/chest: Effort normal and breath sounds normal. No wheezing, rales or rhonchi. Abdominal: Soft, nondistended, nontender. Bowel sounds active throughout. There are no masses palpable. No hepatomegaly. Neurological: Alert and oriented to person place and time. Skin: Skin is warm and dry. Several tattoos.  Tye Savoy, NP  10/29/2022, 8:53 AM

## 2022-10-29 NOTE — Patient Instructions (Addendum)
_______________________________________________________  If your blood pressure at your visit was 140/90 or greater, please contact your primary care physician to follow up on this.  If you are age 32 or younger, your body mass index should be between 19-25. Your Body mass index is 29.69 kg/m. If this is out of the aformentioned range listed, please consider follow up with your Primary Care Provider.  ________________________________________________________  The [facility] providers would like to encourage you to use [facility] to communicate with providers for non-urgent requests or questions.  Due to long hold times on the telephone, sending your provider a message by [facility] may be a faster and more efficient way to get a response.  Please allow 48 business hours for a response.  Please remember that this is for non-urgent requests.  _______________________________________________________  We have sent the following medications to your pharmacy for you to pick up at your convenience:  CONTINUE: omeprazole 20mg  one capsule 30 minutes prior to breakfast meal each day  START: famotidine 20mg  one tablet at bedtime.   You have been scheduled for an endoscopy. Please follow written instructions given to you at your visit today. If you use inhalers (even only as needed), please bring them with you on the day of your procedure.  Due to recent changes in healthcare laws, you may see the results of your imaging and laboratory studies on MyChart before your provider has had a chance to review them.  We understand that in some cases there may be results that are confusing or concerning to you. Not all laboratory results come back in the same time frame and the provider may be waiting for multiple results in order to interpret others.  Please give [place] 48 hours in order for your provider to thoroughly review all the results before contacting the office for clarification of your results.   You are scheduled to  follow up in our office on 12-10-22 at 3pm.  Acid Reflux  Below are some measures you can take to possibly improve acid reflux symptoms . We may have discussed some of these today in the office. Not everything on this list may apply to you   --If you are taking anti-reflux ( GERD) medication be sure to take it 30 minutes before breakfast and if taking twice daily then also second dose should be 30 minutes before dinner.   --Avoid late meals / bedtime snacks.   --Avoid trigger foods ( foods which you know tend to aggravate you reflux symptoms). Some common trigger foods include spicy foods, fatty foods, acidic foods, chocolate and caffeine.  --Elevate the head of bed 6-8 inches on blocks or bricks. If not able to elevate the head of the bed consider purchasing a wedge pillow to sleep on.    --Weight reduction / maintain a healthy BMI ( body mass index) may be help with reflux symptoms  --Sometimes with the above mentioned "lifestyle changes" patients are able to reduce the amount of GERD medications they take. Our goal is to have you on the lowest effective dose of medication.  Thank you for entrusting me with your care and choosing [facility].  Tye Savoy, NP

## 2022-10-30 NOTE — Progress Notes (Deleted)
  Subjective:    Isaac Norris - 32 y.o. male MRN [id number]  Date of birth: 1991-04-01  HPI  Isaac Norris is to establish care.   Current issues and/or concerns: Gastro - GERD  HTN - Lisinopril-HCTZ   ROS per HPI     Health Maintenance:  Health Maintenance Due  Topic Date Due   COVID-19 Vaccine (1) Never done   DTaP/Tdap/Td (1 - Tdap) Never done   INFLUENZA VACCINE  Never done     Past Medical History: There are no problems to display for this patient.     Social History   reports that he quit smoking about 9 years ago. His smoking use included cigarettes. He does not have any smokeless tobacco history on file. He reports current alcohol use of about 3.0 standard drinks of alcohol per week. He reports current drug use. Drug: Marijuana.   Family History  family history includes Diabetes in his mother.   Medications: reviewed and updated   Objective:   Physical Exam There were no vitals taken for this visit. Physical Exam      Assessment & Plan:         Patient was given clear instructions to go to Emergency Department or return to medical center if symptoms don't improve, worsen, or new problems develop.The patient verbalized understanding.  I discussed the assessment and treatment plan with the patient. The patient was provided an opportunity to ask questions and all were answered. The patient agreed with the plan and demonstrated an understanding of the instructions.   The patient was advised to call back or seek an in-person evaluation if the symptoms worsen or if the condition fails to improve as anticipated.    Durene Fruits, NP 10/30/2022, 1:04 PM Primary Care at [facility]

## 2022-11-06 ENCOUNTER — Ambulatory Visit: Payer: Commercial Managed Care - HMO | Admitting: Family

## 2022-11-06 DIAGNOSIS — Z7689 Persons encountering health services in other specified circumstances: Secondary | ICD-10-CM

## 2022-11-10 NOTE — Progress Notes (Signed)
Agree with assessment/plan.  Raj Elyn Krogh, MD  GI [phone]  

## 2022-11-14 NOTE — Progress Notes (Signed)
Erroneous encounter-disregard

## 2022-11-15 ENCOUNTER — Ambulatory Visit: Payer: Self-pay | Admitting: Family

## 2022-11-20 ENCOUNTER — Encounter: Payer: Commercial Managed Care - HMO | Admitting: Family

## 2022-11-20 DIAGNOSIS — Z7689 Persons encountering health services in other specified circumstances: Secondary | ICD-10-CM

## 2022-11-25 ENCOUNTER — Telehealth: Payer: Self-pay | Admitting: Gastroenterology

## 2022-11-25 ENCOUNTER — Encounter: Payer: Commercial Managed Care - HMO | Admitting: Gastroenterology

## 2022-11-25 NOTE — Telephone Encounter (Signed)
Hi Dr Chales Abrahams,  I've called and left a voice message for patient regarding his appointment, patient did not respond nor came to his appointment. I will no show patient for today's appointment.   Thank you

## 2022-12-10 ENCOUNTER — Ambulatory Visit: Payer: Commercial Managed Care - HMO | Admitting: Nurse Practitioner

## 2022-12-17 ENCOUNTER — Ambulatory Visit: Payer: Commercial Managed Care - HMO | Admitting: Family

## 2023-01-29 ENCOUNTER — Ambulatory Visit: Payer: Commercial Managed Care - HMO | Admitting: Family

## 2024-05-11 ENCOUNTER — Telehealth
